# Patient Record
Sex: Male | Born: 1960 | Race: White | Hispanic: No | Smoking: Current every day smoker
Health system: Southern US, Community
[De-identification: ages and names within clinical notes are randomized; demographics above are authoritative.]

## PROBLEM LIST (undated history)

## (undated) DIAGNOSIS — E119 Type 2 diabetes mellitus without complications: Secondary | ICD-10-CM

## (undated) DIAGNOSIS — M1990 Unspecified osteoarthritis, unspecified site: Secondary | ICD-10-CM

## (undated) DIAGNOSIS — E785 Hyperlipidemia, unspecified: Secondary | ICD-10-CM

## (undated) DIAGNOSIS — IMO0001 Reserved for inherently not codable concepts without codable children: Secondary | ICD-10-CM

## (undated) DIAGNOSIS — I10 Essential (primary) hypertension: Secondary | ICD-10-CM

## (undated) DIAGNOSIS — M62838 Other muscle spasm: Secondary | ICD-10-CM

## (undated) DIAGNOSIS — F419 Anxiety disorder, unspecified: Secondary | ICD-10-CM

## (undated) HISTORY — PX: PILONIDAL CYST EXCISION: SHX744

---

## 1998-05-23 HISTORY — PX: BACK SURGERY: SHX140

## 1998-05-23 HISTORY — PX: HERNIA REPAIR: SHX51

## 1999-02-23 ENCOUNTER — Encounter: Payer: Self-pay | Admitting: Emergency Medicine

## 1999-02-23 ENCOUNTER — Emergency Department (HOSPITAL_COMMUNITY): Admission: EM | Admit: 1999-02-23 | Discharge: 1999-02-23 | Payer: Self-pay | Admitting: Emergency Medicine

## 2000-03-09 ENCOUNTER — Emergency Department (HOSPITAL_COMMUNITY): Admission: EM | Admit: 2000-03-09 | Discharge: 2000-03-09 | Payer: Self-pay | Admitting: Emergency Medicine

## 2001-07-18 ENCOUNTER — Encounter: Admission: RE | Admit: 2001-07-18 | Discharge: 2001-07-18 | Payer: Self-pay | Admitting: Family Medicine

## 2001-07-18 ENCOUNTER — Encounter: Payer: Self-pay | Admitting: Family Medicine

## 2001-08-16 ENCOUNTER — Encounter: Payer: Self-pay | Admitting: Neurosurgery

## 2001-08-16 ENCOUNTER — Ambulatory Visit (HOSPITAL_COMMUNITY): Admission: RE | Admit: 2001-08-16 | Discharge: 2001-08-17 | Payer: Self-pay | Admitting: Neurosurgery

## 2006-07-31 ENCOUNTER — Encounter: Admission: RE | Admit: 2006-07-31 | Discharge: 2006-07-31 | Payer: Self-pay | Admitting: Family Medicine

## 2008-08-15 ENCOUNTER — Emergency Department (HOSPITAL_COMMUNITY): Admission: EM | Admit: 2008-08-15 | Discharge: 2008-08-15 | Payer: Self-pay | Admitting: Emergency Medicine

## 2009-10-05 IMAGING — CT CT ABDOMEN W/ CM
2 series · 14 of 42 positions shown, 19 images · IV contrast (water & 100 ML OMNI 300)
Comparison: None

CT ABDOMEN

CLINICAL DATA: Abdominal pain with distention and shortness of
breath.

CT ABDOMEN AND PELVIS WITH CONTRAST
TECHNIQUE: Multidetector CT imaging of the abdomen and pelvis was
performed using the standard protocol following bolus
administration of intravenous contrast.
Contrast: 100 ml Vmnipaque-ONN

[Series 400: reformatted · coronal · 0.92mm/px · 13 of 110 slices shown, 17 images (1 of 2)]
[im 8/110  soft-tissue]
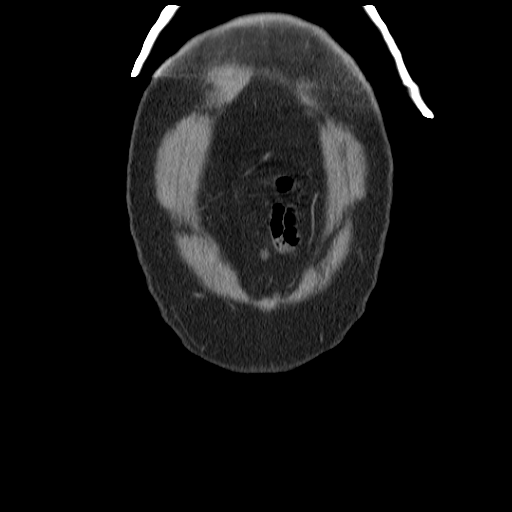
[im 8/110  lung]
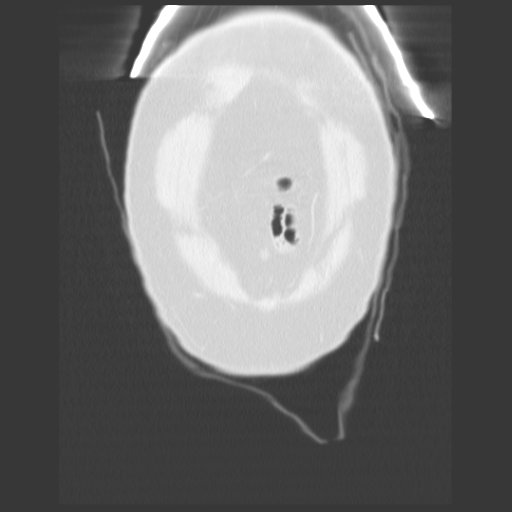
[im 8/110  bone]
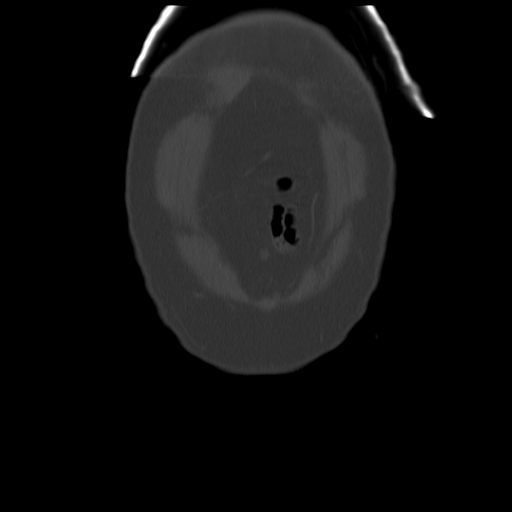
[im 16/110  soft-tissue]
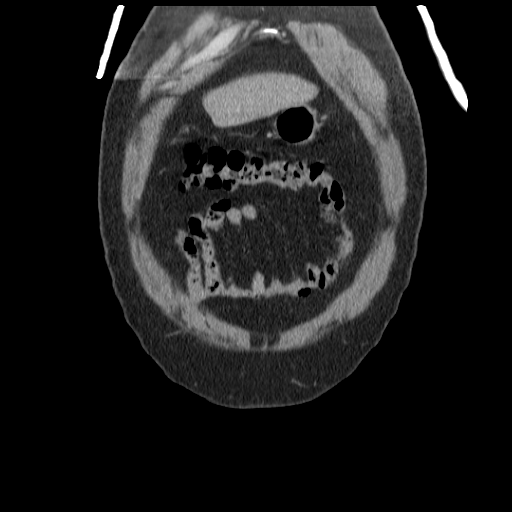
[im 16/110  lung]
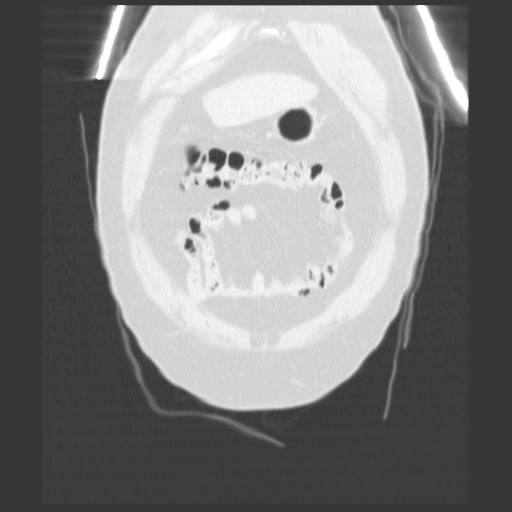
[im 24/110  lung]
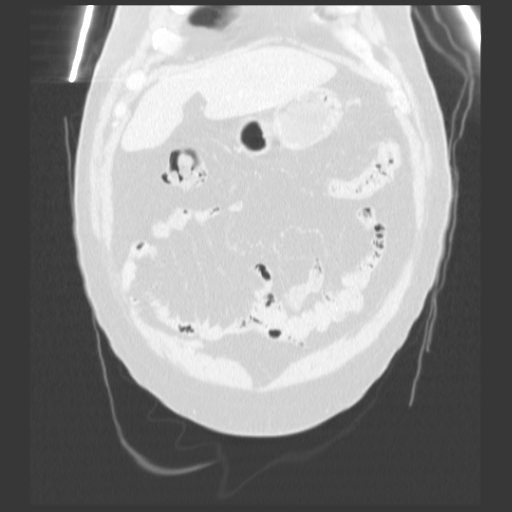
[im 25/110  soft-tissue]
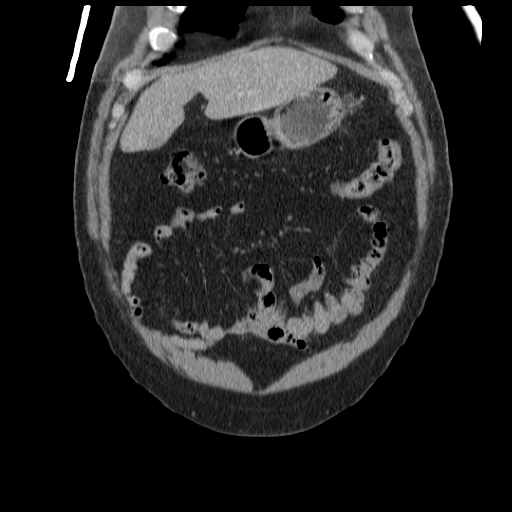
[im 32/110  lung]
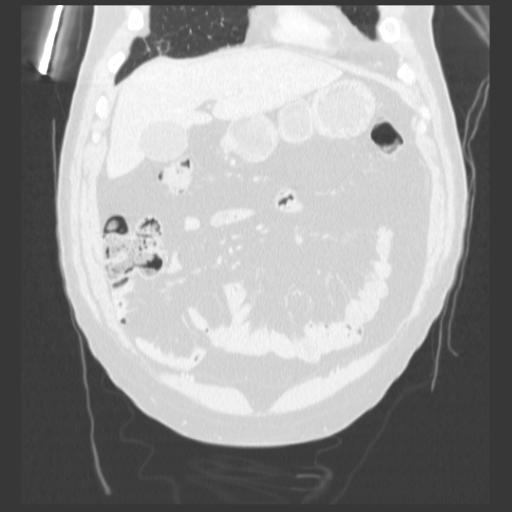
[im 37/110  soft-tissue]
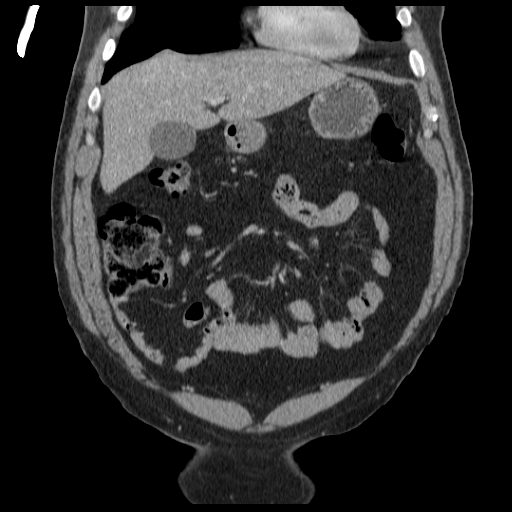
[im 47/110  soft-tissue]
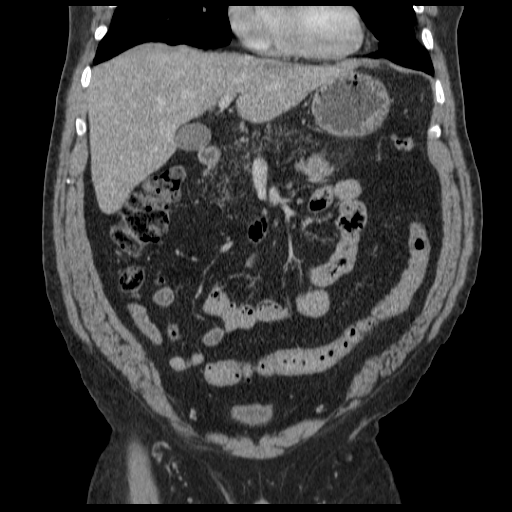
[im 55/110  soft-tissue]
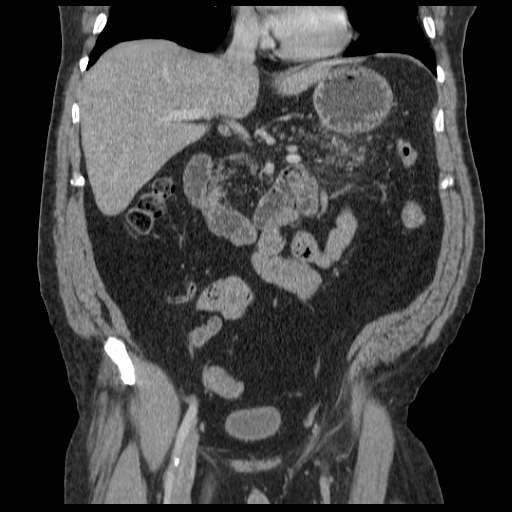
[im 63/110  soft-tissue]
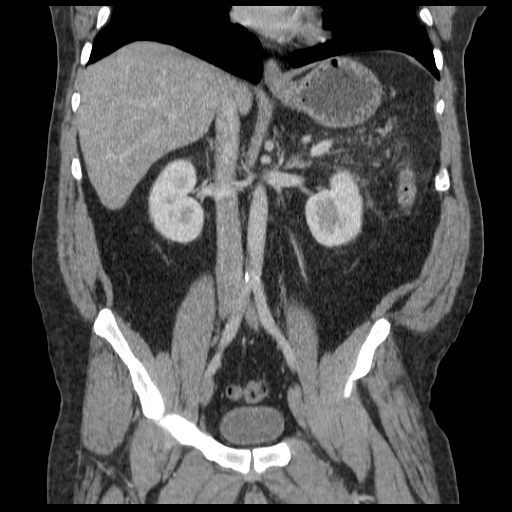
[im 73/110  soft-tissue]
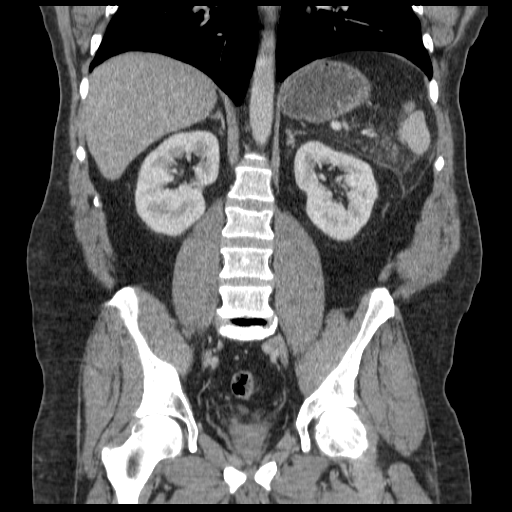
[im 85/110  soft-tissue]
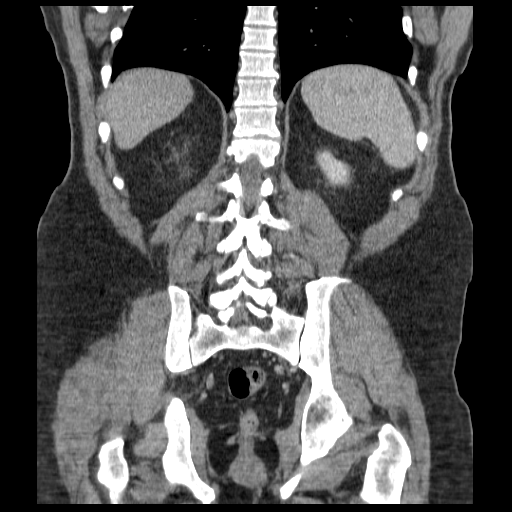
[im 85/110  bone]
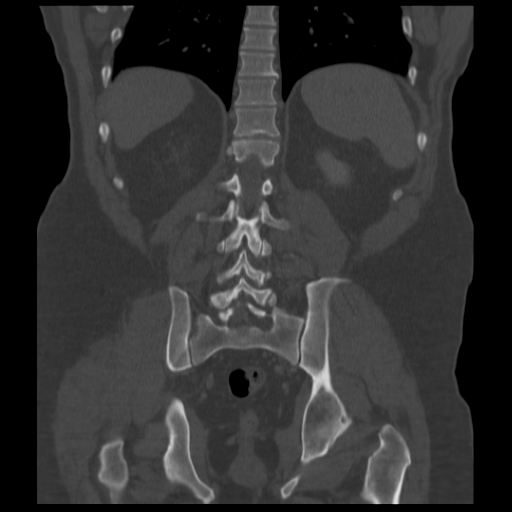
[im 94/110  soft-tissue]
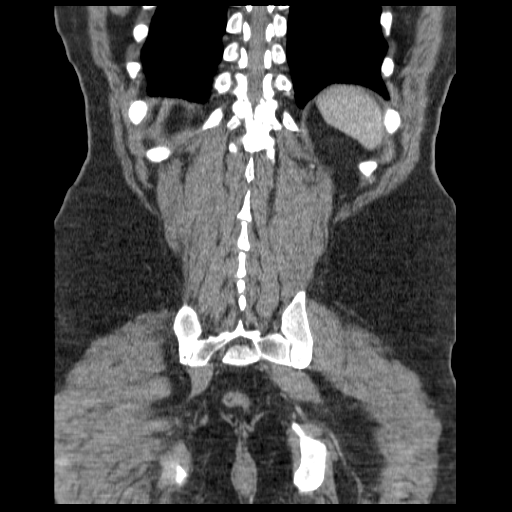
[im 102/110  soft-tissue]
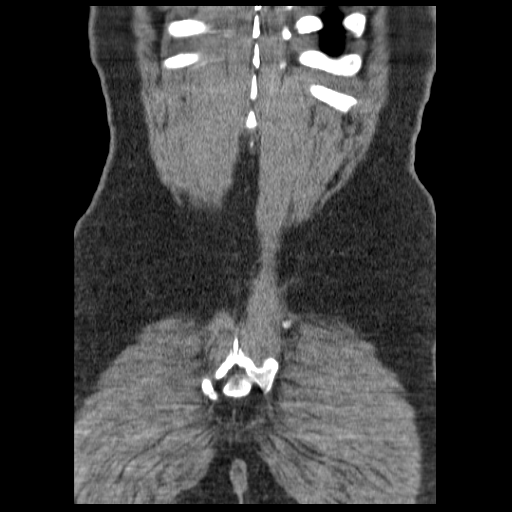

[Series 401: reformatted · sagittal · 0.92mm/px · 1 of 126 slices shown, 2 images (2 of 2)]
[im 63/126  soft-tissue]
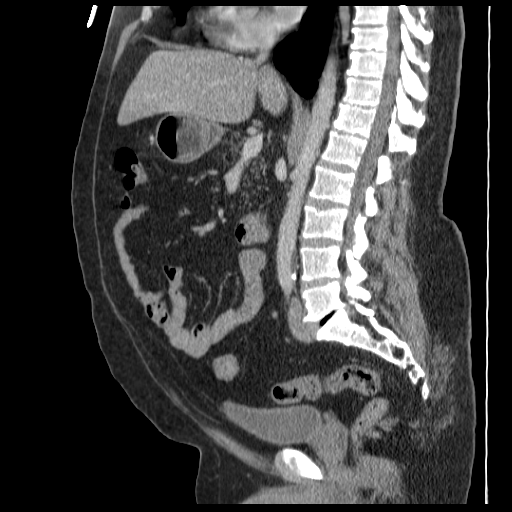
[im 63/126  bone]
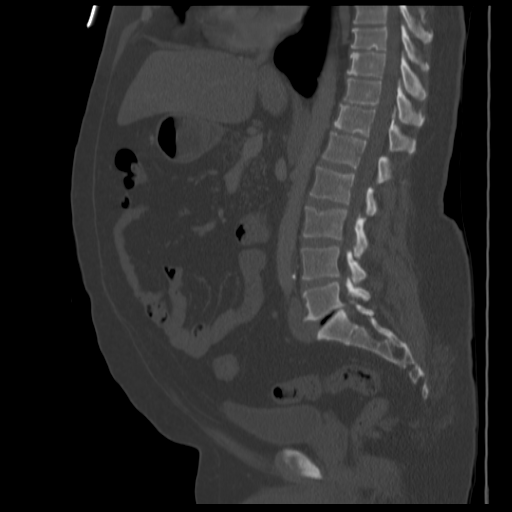

[14 of 42 positions shown; findings below may reference images not displayed]

FINDINGS: There is evidence of mild pancreatitis involving the
tail with peripancreatic inflammation and a small area of
thickening of the anterior aspect of Gerota's fascia.  There is no
pseudocyst formation or  ascites.

The liver, spleen, adrenal glands, and kidneys are normal.  There
are no dilated loops of large or small bowel.  No significant bony
abnormality.
IMPRESSION: Pancreatitis involving the distal one half of the pancreas with
mild peripancreatic inflammation.

CT PELVIS
FINDINGS: There are a few diverticuli in the distal colon.  No
diverticulitis.  The terminal ileum and appendix appear normal.  No
free fluid or bony abnormality.
IMPRESSION: Mild diverticulosis.

## 2009-11-08 ENCOUNTER — Emergency Department (HOSPITAL_COMMUNITY): Admission: EM | Admit: 2009-11-08 | Discharge: 2009-11-08 | Payer: Self-pay | Admitting: Emergency Medicine

## 2010-09-02 LAB — DIFFERENTIAL
Basophils Absolute: 0 10*3/uL (ref 0.0–0.1)
Eosinophils Absolute: 0.2 10*3/uL (ref 0.0–0.7)
Eosinophils Relative: 1 % (ref 0–5)
Lymphocytes Relative: 13 % (ref 12–46)
Monocytes Absolute: 0.8 10*3/uL (ref 0.1–1.0)

## 2010-09-02 LAB — COMPREHENSIVE METABOLIC PANEL
ALT: 31 U/L (ref 0–53)
AST: 20 U/L (ref 0–37)
Albumin: 4 g/dL (ref 3.5–5.2)
Alkaline Phosphatase: 107 U/L (ref 39–117)
CO2: 23 mEq/L (ref 19–32)
Chloride: 100 mEq/L (ref 96–112)
Creatinine, Ser: 0.93 mg/dL (ref 0.4–1.5)
GFR calc Af Amer: >60 mL/min (ref >60)
GFR calc non Af Amer: >60 mL/min (ref >60)
Potassium: 4.1 mEq/L (ref 3.5–5.1)
Sodium: 133 mEq/L — ABNORMAL LOW (ref 135–145)
Total Bilirubin: 0.7 mg/dL (ref 0.3–1.2)

## 2010-09-02 LAB — URINE MICROSCOPIC-ADD ON

## 2010-09-02 LAB — URINALYSIS, ROUTINE W REFLEX MICROSCOPIC
Glucose, UA: NEGATIVE mg/dL
Hgb urine dipstick: NEGATIVE
Specific Gravity, Urine: 1.027 (ref 1.005–1.030)
Urobilinogen, UA: 1 mg/dL (ref 0.0–1.0)

## 2010-09-02 LAB — CBC
MCV: 92.3 fL (ref 78.0–100.0)
Platelets: 233 10*3/uL (ref 150–400)
RBC: 5.13 MIL/uL (ref 4.22–5.81)
WBC: 13.2 10*3/uL — ABNORMAL HIGH (ref 4.0–10.5)

## 2010-09-02 LAB — LIPASE, BLOOD: Lipase: 67 U/L — ABNORMAL HIGH (ref 11–59)

## 2010-10-08 NOTE — Op Note (Signed)
[place]  Patient:    Kevin Fletcher, Kevin Fletcher Visit Number: [id number] MRN: [id number]          Service Type: DSU Location: 3000 3041 01 Attending Physician:  Jackelyn Knife Dictated by:   Izell [place] Elesa Hacker, M.D. Proc. Date: 08/16/01 Admit Date:  08/16/2001 Discharge Date: 08/17/2001                             Operative Report  PREOPERATIVE DIAGNOSIS:  Herniated intervertebral disk, right L5-S1.  POSTOPERATIVE DIAGNOSIS:  Herniated intervertebral disk, right L5-S1.  OPERATION PERFORMED:  Right L5-S1 hemilaminotomy and diskectomy.  SURGEON:  Izell [place]. Elesa Hacker, M.D.  ASSISTANT:  Clydene Fake, M.D.  DESCRIPTION OF PROCEDURE:  Under general endotracheal anesthesia, the usual subperiosteal approach was carried out to the right L5-S1 interlaminar space. The proper operative level was determined with intraoperative x-rays.  Using the operating microscope and a high speed drill, a hemilaminotomy was performed.  The ligamentum flavum was removed to reveal the dural sac and nerve root which were both displaced dorsally, and underlying soft tissue mass that proved to be a well localized disk herniation which was quite large with one small area of extrusion.  The annular defect was enlarged with a #15 blade, and a large quantity of degenerative disk material was removed. Careful search was then carried out to make sure that there were no retained fragments and none were found.  The wound was copiously irrigated with antibiotic solution and then closed in layers.  A sterile dressing was applied, and the patient was taken to the recovery room in good condition. Dictated by:   Izell [place] Elesa Hacker, M.D. Attending Physician:  Jackelyn Knife DD:  08/16/01 TD:  08/18/01 Job: [phone]

## 2010-10-08 NOTE — H&P (Signed)
[place]  Patient:    Kevin Fletcher, Kevin Fletcher Visit Number: [id number] MRN: [id number]          Service Type: DSU Location: 3000 3041 01 Attending Physician:  Jackelyn Knife Dictated by:   Izell [place] Elesa Hacker, M.D. Admit Date:  08/16/2001                           History and Physical  HISTORY OF PRESENT ILLNESS:  Mr. [name] is a 50 year old male who presents with a chief complaint of radiating pain in his right leg, but also some pain in the groin on the right side and in his right testicle.  He apparently had some difficulty with similar symptoms a year or so ago, but this has gotten worse since the onset.  He does recall falling in December of 2002 when he was hanging a ceiling fan, but at that point he did not necessarily have the current problem.  He also notes that he did have pain prior to that fall.  He has had really minimal back pain and mostly right lower extremity pain.  He has had no difficulty with bowel or bladder control. He notes that activity makes his right leg pain worse and his pain is relieved to some extent by reduced activity.  He had a lumbosacral MRI study done on July 18, 2001, which I reviewed. This study shows a discrimination at L5-S1 and centrally and right-sided.  He is admitted at this time for surgical decompression.  PAST MEDICAL HISTORY:  He has had borderline increased high blood pressure. Otherwise he has been healthy.  FAMILY HISTORY:  His mother is living at age 87, but is diabetic.  His father died at age 27 of throat cancer.  He has three brothers and one sister living and well.  He has no children.  PAST SURGICAL HISTORY:  He had repair of an umbilical hernia in 1997.  He had removal of a pilonidal cyst in 1998.  CURRENT MEDICATIONS:  None.  ALLERGIES:  He has no known drug allergies.  PERSONAL HISTORY:  He smokes a pack of cigarettes a day and drinks beer.  He is employed as a  Music therapist.  He does have people working for him so he is able to oversee jobs and does not have to do necessarily the hard physically demanding hands on type labor.  PHYSICAL EXAMINATION:  He is alert and cooperative.  WEIGHT:  222 pounds.  HEIGHT:  5 feet 10 inches tall.  VITAL SIGNS:  His blood pressure was 162/100 and rechecked at 146/98.  HEENT:  Examination is normal.  CHEST:  Clear.  ABDOMEN:  Soft and nontender with no palpable masses.  BACK:  Examination of the back and spine reveal a well-healed pilonidal cyst repair scar in the midline.  NEUROLOGIC:  He is able to walk in heel and toe at this time, but he has increased pain on weightbearing on the right.  Range of motion is limited because of stiffness and pain when he tilts to the right side.  Straight leg raising is quite painful on the right and negative on the left.  Deep tendon reflexes are 1-2+ and symmetrical, except for some diminution of the right ankle reflex.  He has some posterolateral loss of sensation on the right side. He is able to walk on heel and toe at this time.  IMPRESSION:  Herniated intravertebral disk at right L5-S1 with  a smaller one on the left side at L4-5.  The details and goals and risks of surgical decompression were explained that the risks include the risk of bleeding, infection, damage to the nerve root or common dural tube with CSF leak, resultant loss of sensation, and loss of motor function.  He understands that there is some risk involved with anesthetic.  We plan surgical decompression at L5-S1 on the right side tomorrow. Dictated by:   Izell [place] Elesa Hacker, M.D. Attending Physician:  Jackelyn Knife DD:  08/15/01 TD:  08/15/01 Job: [phone] [id number]

## 2014-05-28 ENCOUNTER — Other Ambulatory Visit (HOSPITAL_COMMUNITY): Payer: Self-pay | Admitting: Neurosurgery

## 2014-06-06 ENCOUNTER — Encounter (HOSPITAL_COMMUNITY)
Admission: RE | Admit: 2014-06-06 | Discharge: 2014-06-06 | Disposition: A | Discharge status: Home / Self Care | Payer: BLUE CROSS/BLUE SHIELD | Source: Ambulatory Visit | Attending: Neurosurgery | Admitting: Neurosurgery

## 2014-06-06 ENCOUNTER — Encounter (HOSPITAL_COMMUNITY): Payer: Self-pay

## 2014-06-06 DIAGNOSIS — Z79891 Long term (current) use of opiate analgesic: Secondary | ICD-10-CM | POA: Diagnosis not present

## 2014-06-06 DIAGNOSIS — G8929 Other chronic pain: Secondary | ICD-10-CM | POA: Diagnosis not present

## 2014-06-06 DIAGNOSIS — Y848 Other medical procedures as the cause of abnormal reaction of the patient, or of later complication, without mention of misadventure at the time of the procedure: Secondary | ICD-10-CM | POA: Insufficient documentation

## 2014-06-06 DIAGNOSIS — Z01812 Encounter for preprocedural laboratory examination: Secondary | ICD-10-CM | POA: Diagnosis present

## 2014-06-06 DIAGNOSIS — Z01818 Encounter for other preprocedural examination: Secondary | ICD-10-CM | POA: Diagnosis not present

## 2014-06-06 DIAGNOSIS — F17210 Nicotine dependence, cigarettes, uncomplicated: Secondary | ICD-10-CM | POA: Diagnosis not present

## 2014-06-06 DIAGNOSIS — I10 Essential (primary) hypertension: Secondary | ICD-10-CM | POA: Diagnosis not present

## 2014-06-06 DIAGNOSIS — M961 Postlaminectomy syndrome, not elsewhere classified: Secondary | ICD-10-CM | POA: Insufficient documentation

## 2014-06-06 DIAGNOSIS — M1990 Unspecified osteoarthritis, unspecified site: Secondary | ICD-10-CM | POA: Diagnosis not present

## 2014-06-06 DIAGNOSIS — E119 Type 2 diabetes mellitus without complications: Secondary | ICD-10-CM | POA: Diagnosis not present

## 2014-06-06 DIAGNOSIS — Z792 Long term (current) use of antibiotics: Secondary | ICD-10-CM | POA: Diagnosis not present

## 2014-06-06 HISTORY — DX: Type 2 diabetes mellitus without complications: E11.9

## 2014-06-06 HISTORY — DX: Essential (primary) hypertension: I10

## 2014-06-06 HISTORY — DX: Reserved for inherently not codable concepts without codable children: IMO0001

## 2014-06-06 HISTORY — DX: Unspecified osteoarthritis, unspecified site: M19.90

## 2014-06-06 LAB — BASIC METABOLIC PANEL
ANION GAP: 11 (ref 5–15)
BUN: 9 mg/dL (ref 6–23)
CHLORIDE: 103 meq/L (ref 96–112)
CO2: 26 mmol/L (ref 19–32)
Calcium: 9.6 mg/dL (ref 8.4–10.5)
Creatinine, Ser: 0.73 mg/dL (ref 0.50–1.35)
Glucose, Bld: 134 mg/dL — ABNORMAL HIGH (ref 70–99)
POTASSIUM: 4.1 mmol/L (ref 3.5–5.1)
Sodium: 140 mmol/L (ref 135–145)

## 2014-06-06 LAB — SURGICAL PCR SCREEN
MRSA, PCR: NEGATIVE
Staphylococcus aureus: POSITIVE — AB

## 2014-06-06 LAB — CBC
HEMATOCRIT: 42.4 % (ref 39.0–52.0)
HEMOGLOBIN: 14.8 g/dL (ref 13.0–17.0)
MCH: 31.6 pg (ref 26.0–34.0)
MCHC: 34.9 g/dL (ref 30.0–36.0)
MCV: 90.6 fL (ref 78.0–100.0)
Platelets: 258 10*3/uL (ref 150–400)
RBC: 4.68 MIL/uL (ref 4.22–5.81)
RDW: 12.7 % (ref 11.5–15.5)
WBC: 9.5 10*3/uL (ref 4.0–10.5)

## 2014-06-06 NOTE — Pre-Procedure Instructions (Addendum)
Kevin Fletcher  06/06/2014   Your procedure is scheduled on:  06/12/14  [facility] short [facility] at 1250 AM.  Call this number if you have problems the morning of surgery: [phone]   Remember:   Do not eat food or drink liquids after midnight.   Take these medicines the morning of surgery with A SIP OF WATER: gabapentin,pain if needed cymbalta unless dr tells you not too     STOP all herbel meds, nsaids (aleve,naproxen,advil,ibuprofen) 5 days prior to surgery starting tomorrow including vitamins,aspirin   Do not wear jewelry, make-up or nail polish.  Do not wear lotions, powders, or perfumes. You may wear deodorant.  Do not shave 48 hours prior to surgery. Men may shave face and neck.  Do not bring valuables to the hospital.  [facility] is not responsible                  for any belongings or valuables.               Contacts, dentures or bridgework may not be worn into surgery.  Leave suitcase in the car. After surgery it may be brought to your room.  For patients admitted to the hospital, discharge time is determined by your                treatment team.               Patients discharged the day of surgery will not be allowed to drive  home.  Name and phone number of your driver:   Special Instructions:  Special Instructions: [place] - Preparing for Surgery  Before surgery, you can play an important role.  Because skin is not sterile, your skin needs to be as free of germs as possible.  You can reduce the number of germs on you skin by washing with CHG (chlorahexidine gluconate) soap before surgery.  CHG is an antiseptic cleaner which kills germs and bonds with the skin to continue killing germs even after washing.  Please DO NOT use if you have an allergy to CHG or antibacterial soaps.  If your skin becomes reddened/irritated stop using the CHG and inform your nurse when you arrive at Short Stay.  Do not shave (including legs and underarms) for at least  48 hours prior to the first CHG shower.  You may shave your face.  Please follow these instructions carefully:   1.  Shower with CHG Soap the night before surgery and the morning of Surgery.  2.  If you choose to wash your hair, wash your hair first as usual with your normal shampoo.  3.  After you shampoo, rinse your hair and body thoroughly to remove the Shampoo.  4.  Use CHG as you would any other liquid soap.  You can apply chg directly  to the skin and wash gently with scrungie or a clean washcloth.  5.  Apply the CHG Soap to your body ONLY FROM THE NECK DOWN.  Do not use on open wounds or open sores.  Avoid contact with your eyes ears, mouth and genitals (private parts).  Wash genitals (private parts)       with your normal soap.  6.  Wash thoroughly, paying special attention to the area where your surgery will be performed.  7.  Thoroughly rinse your body with warm water from the neck down.  8.  DO NOT shower/wash with your normal soap after using and  rinsing off the CHG Soap.  9.  Pat yourself dry with a clean towel.            10.  Wear clean pajamas.            11.  Place clean sheets on your bed the night of your first shower and do not sleep with pets.  Day of Surgery  Do not apply any lotions/deodorants the morning of surgery.  Please wear clean clothes to the hospital/surgery center.   Please read over the following fact sheets that you were given: Pain Booklet, Coughing and Deep Breathing, MRSA Information and Surgical Site Infection Prevention

## 2014-06-06 NOTE — Progress Notes (Addendum)
Office called re: stop cymbalta        Patient had a procedure done in office to try to do stimulator non surgically.had to stop cymbalta before procedure due to ? Potential to cause bleeding? Marland Kitchen. Office to check with stimulator rep to see if need to stop before surgery. Sarah at office called and patient does not have to stop cymbalta.patient called and instructed to continue med

## 2014-06-11 MED ORDER — CEFAZOLIN SODIUM-DEXTROSE 2-3 GM-% IV SOLR
2.0000 g | INTRAVENOUS | Status: AC
  Administered 2014-06-12: 2 g via INTRAVENOUS

## 2014-06-12 ENCOUNTER — Encounter (HOSPITAL_COMMUNITY): Payer: Self-pay | Admitting: Certified Registered Nurse Anesthetist

## 2014-06-12 ENCOUNTER — Encounter (HOSPITAL_COMMUNITY)
Admission: RE | Disposition: A | Discharge status: Home / Self Care | Payer: Self-pay | Source: Ambulatory Visit | Attending: Neurosurgery

## 2014-06-12 ENCOUNTER — Ambulatory Visit (HOSPITAL_COMMUNITY): Payer: BLUE CROSS/BLUE SHIELD | Admitting: Certified Registered Nurse Anesthetist

## 2014-06-12 ENCOUNTER — Ambulatory Visit (HOSPITAL_COMMUNITY): Payer: BLUE CROSS/BLUE SHIELD

## 2014-06-12 ENCOUNTER — Ambulatory Visit (HOSPITAL_COMMUNITY)
Admission: RE | Admit: 2014-06-12 | Discharge: 2014-06-13 | Disposition: A | Discharge status: Home / Self Care | Payer: BLUE CROSS/BLUE SHIELD | Source: Ambulatory Visit | Attending: Neurosurgery | Admitting: Neurosurgery

## 2014-06-12 DIAGNOSIS — M961 Postlaminectomy syndrome, not elsewhere classified: Secondary | ICD-10-CM | POA: Diagnosis not present

## 2014-06-12 DIAGNOSIS — E119 Type 2 diabetes mellitus without complications: Secondary | ICD-10-CM | POA: Insufficient documentation

## 2014-06-12 DIAGNOSIS — I10 Essential (primary) hypertension: Secondary | ICD-10-CM | POA: Insufficient documentation

## 2014-06-12 DIAGNOSIS — M1990 Unspecified osteoarthritis, unspecified site: Secondary | ICD-10-CM | POA: Insufficient documentation

## 2014-06-12 DIAGNOSIS — G8929 Other chronic pain: Secondary | ICD-10-CM | POA: Diagnosis present

## 2014-06-12 DIAGNOSIS — Z79891 Long term (current) use of opiate analgesic: Secondary | ICD-10-CM | POA: Insufficient documentation

## 2014-06-12 DIAGNOSIS — Z792 Long term (current) use of antibiotics: Secondary | ICD-10-CM | POA: Insufficient documentation

## 2014-06-12 DIAGNOSIS — F17210 Nicotine dependence, cigarettes, uncomplicated: Secondary | ICD-10-CM | POA: Insufficient documentation

## 2014-06-12 HISTORY — PX: SPINAL CORD STIMULATOR TRIAL: SHX5380

## 2014-06-12 LAB — GLUCOSE, CAPILLARY
GLUCOSE-CAPILLARY: 102 mg/dL — AB (ref 70–99)
Glucose-Capillary: 91 mg/dL (ref 70–99)
Glucose-Capillary: 106 mg/dL — ABNORMAL HIGH (ref 70–99)

## 2014-06-12 SURGERY — LUMBAR SPINAL CORD STIMULATOR TRIAL
Anesthesia: General | Site: Back

## 2014-06-12 MED ORDER — SUCCINYLCHOLINE CHLORIDE 20 MG/ML IJ SOLN
INTRAMUSCULAR | Status: AC
  Filled 2014-06-12: qty 1

## 2014-06-12 MED ORDER — ACETAMINOPHEN 650 MG RE SUPP: 650.0000 mg | RECTAL | Status: DC | PRN

## 2014-06-12 MED ORDER — MIDAZOLAM HCL 2 MG/2ML IJ SOLN
INTRAMUSCULAR | Status: AC
  Filled 2014-06-12: qty 2

## 2014-06-12 MED ORDER — DULOXETINE HCL 30 MG PO CPEP
30.0000 mg | ORAL_CAPSULE | Freq: Two times a day (BID) | ORAL | Status: DC
  Administered 2014-06-13: 30 mg via ORAL
  Filled 2014-06-12 (×3): qty 1

## 2014-06-12 MED ORDER — POLYETHYLENE GLYCOL 3350 17 G PO PACK
17.0000 g | PACK | Freq: Every day | ORAL | Status: DC | PRN
  Filled 2014-06-12: qty 1

## 2014-06-12 MED ORDER — OXYCODONE-ACETAMINOPHEN 5-325 MG PO TABS
1.0000 | ORAL_TABLET | ORAL | Status: DC | PRN
  Administered 2014-06-12 – 2014-06-13 (×2): 2 via ORAL
  Filled 2014-06-12 (×2): qty 2

## 2014-06-12 MED ORDER — PHENYLEPHRINE HCL 10 MG/ML IJ SOLN
INTRAMUSCULAR | Status: AC
  Filled 2014-06-12: qty 1

## 2014-06-12 MED ORDER — HEMOSTATIC AGENTS (NO CHARGE) OPTIME
TOPICAL | Status: DC | PRN
  Administered 2014-06-12: 1 via TOPICAL

## 2014-06-12 MED ORDER — SODIUM CHLORIDE 0.9 % IJ SOLN: 3.0000 mL | INTRAMUSCULAR | Status: DC | PRN

## 2014-06-12 MED ORDER — PHENYLEPHRINE HCL 10 MG/ML IJ SOLN
INTRAMUSCULAR | Status: DC | PRN
  Administered 2014-06-12: 80 ug via INTRAVENOUS

## 2014-06-12 MED ORDER — FENTANYL CITRATE 0.05 MG/ML IJ SOLN
INTRAMUSCULAR | Status: AC
  Filled 2014-06-12: qty 5

## 2014-06-12 MED ORDER — ARTIFICIAL TEARS OP OINT
TOPICAL_OINTMENT | OPHTHALMIC | Status: DC | PRN
  Administered 2014-06-12: 1 via OPHTHALMIC

## 2014-06-12 MED ORDER — SODIUM CHLORIDE 0.9 % IV SOLN
INTRAVENOUS | Status: DC | PRN
  Administered 2014-06-12: 19:00:00 via INTRAVENOUS

## 2014-06-12 MED ORDER — OXYCODONE-ACETAMINOPHEN 5-325 MG PO TABS
ORAL_TABLET | ORAL | Status: AC
  Filled 2014-06-12: qty 2

## 2014-06-12 MED ORDER — ONDANSETRON HCL 4 MG/2ML IJ SOLN
INTRAMUSCULAR | Status: DC | PRN
Start: 2014-06-12 — End: 2014-06-12
  Administered 2014-06-12: 4 mg via INTRAVENOUS

## 2014-06-12 MED ORDER — LIDOCAINE HCL (CARDIAC) 20 MG/ML IV SOLN
INTRAVENOUS | Status: DC | PRN
  Administered 2014-06-12: 60 mg via INTRAVENOUS

## 2014-06-12 MED ORDER — LACTATED RINGERS IV SOLN
INTRAVENOUS | Status: DC | PRN
  Administered 2014-06-12: 18:00:00 via INTRAVENOUS

## 2014-06-12 MED ORDER — ACETAMINOPHEN 325 MG PO TABS: 650.0000 mg | ORAL_TABLET | ORAL | Status: DC | PRN

## 2014-06-12 MED ORDER — OXYCODONE HCL 5 MG PO TABS
10.0000 mg | ORAL_TABLET | Freq: Four times a day (QID) | ORAL | Status: DC
  Administered 2014-06-12: 10 mg via ORAL
  Filled 2014-06-12: qty 2

## 2014-06-12 MED ORDER — SODIUM CHLORIDE 0.9 % IV SOLN: 250.0000 mL | INTRAVENOUS | Status: DC

## 2014-06-12 MED ORDER — PHENOL 1.4 % MT LIQD: 1.0000 | OROMUCOSAL | Status: DC | PRN

## 2014-06-12 MED ORDER — ENALAPRIL MALEATE 20 MG PO TABS
20.0000 mg | ORAL_TABLET | Freq: Every day | ORAL | Status: DC
  Filled 2014-06-12: qty 1

## 2014-06-12 MED ORDER — HYDROMORPHONE HCL 1 MG/ML IJ SOLN
INTRAMUSCULAR | Status: AC
  Filled 2014-06-12: qty 1

## 2014-06-12 MED ORDER — ONDANSETRON HCL 4 MG/2ML IJ SOLN: 4.0000 mg | Freq: Once | INTRAMUSCULAR | Status: DC | PRN

## 2014-06-12 MED ORDER — HYDROMORPHONE HCL 1 MG/ML IJ SOLN
0.5000 mg | INTRAMUSCULAR | Status: DC | PRN
  Administered 2014-06-12: 0.5 mg via INTRAVENOUS

## 2014-06-12 MED ORDER — EPHEDRINE SULFATE 50 MG/ML IJ SOLN
INTRAMUSCULAR | Status: DC | PRN
Start: 2014-06-12 — End: 2014-06-12
  Administered 2014-06-12 (×2): 10 mg via INTRAVENOUS

## 2014-06-12 MED ORDER — CEPHALEXIN 500 MG PO CAPS: 500.0000 mg | ORAL_CAPSULE | Freq: Four times a day (QID) | ORAL | Status: AC

## 2014-06-12 MED ORDER — DEXAMETHASONE SODIUM PHOSPHATE 4 MG/ML IJ SOLN
INTRAMUSCULAR | Status: AC
  Filled 2014-06-12: qty 1

## 2014-06-12 MED ORDER — SENNA 8.6 MG PO TABS
1.0000 | ORAL_TABLET | Freq: Two times a day (BID) | ORAL | Status: DC
  Administered 2014-06-12: 8.6 mg via ORAL
  Filled 2014-06-12 (×2): qty 1

## 2014-06-12 MED ORDER — LACTATED RINGERS IV SOLN
INTRAVENOUS | Status: DC
  Administered 2014-06-12: 13:00:00 via INTRAVENOUS

## 2014-06-12 MED ORDER — DIAZEPAM 5 MG PO TABS
ORAL_TABLET | ORAL | Status: AC
  Filled 2014-06-12: qty 1

## 2014-06-12 MED ORDER — EPHEDRINE SULFATE 50 MG/ML IJ SOLN
INTRAMUSCULAR | Status: AC
  Filled 2014-06-12: qty 1

## 2014-06-12 MED ORDER — PROPOFOL 10 MG/ML IV BOLUS
INTRAVENOUS | Status: DC | PRN
  Administered 2014-06-12: 200 mg via INTRAVENOUS

## 2014-06-12 MED ORDER — SODIUM CHLORIDE 0.9 % IJ SOLN: 3.0000 mL | Freq: Two times a day (BID) | INTRAMUSCULAR | Status: DC

## 2014-06-12 MED ORDER — CYCLOBENZAPRINE HCL 10 MG PO TABS: 10.0000 mg | ORAL_TABLET | Freq: Three times a day (TID) | ORAL | Status: AC | PRN

## 2014-06-12 MED ORDER — OXYCODONE HCL 5 MG PO TABS
ORAL_TABLET | ORAL | Status: AC
  Filled 2014-06-12: qty 2

## 2014-06-12 MED ORDER — GLYCOPYRROLATE 0.2 MG/ML IJ SOLN
INTRAMUSCULAR | Status: AC
  Filled 2014-06-12: qty 5

## 2014-06-12 MED ORDER — DIAZEPAM 5 MG PO TABS
5.0000 mg | ORAL_TABLET | Freq: Four times a day (QID) | ORAL | Status: DC | PRN
  Administered 2014-06-12 – 2014-06-13 (×2): 5 mg via ORAL
  Filled 2014-06-12 (×2): qty 1

## 2014-06-12 MED ORDER — 0.9 % SODIUM CHLORIDE (POUR BTL) OPTIME
TOPICAL | Status: DC | PRN
  Administered 2014-06-12: 1000 mL

## 2014-06-12 MED ORDER — GLYCOPYRROLATE 0.2 MG/ML IJ SOLN
INTRAMUSCULAR | Status: DC | PRN
  Administered 2014-06-12: 0.2 mg via INTRAVENOUS

## 2014-06-12 MED ORDER — ONDANSETRON HCL 4 MG/2ML IJ SOLN: 4.0000 mg | INTRAMUSCULAR | Status: DC | PRN

## 2014-06-12 MED ORDER — METFORMIN HCL ER 500 MG PO TB24
500.0000 mg | ORAL_TABLET | Freq: Two times a day (BID) | ORAL | Status: DC
  Filled 2014-06-12 (×3): qty 1

## 2014-06-12 MED ORDER — SUCCINYLCHOLINE CHLORIDE 20 MG/ML IJ SOLN
INTRAMUSCULAR | Status: DC | PRN
  Administered 2014-06-12: 100 mg via INTRAVENOUS

## 2014-06-12 MED ORDER — POTASSIUM CHLORIDE IN NACL 20-0.9 MEQ/L-% IV SOLN
INTRAVENOUS | Status: DC
  Filled 2014-06-12 (×2): qty 1000

## 2014-06-12 MED ORDER — HYDROMORPHONE HCL 1 MG/ML IJ SOLN: 0.5000 mg | INTRAMUSCULAR | Status: DC | PRN

## 2014-06-12 MED ORDER — FENTANYL CITRATE 0.05 MG/ML IJ SOLN
INTRAMUSCULAR | Status: DC | PRN
  Administered 2014-06-12: 50 ug via INTRAVENOUS
  Administered 2014-06-12: 100 ug via INTRAVENOUS
  Administered 2014-06-12: 50 ug via INTRAVENOUS
  Administered 2014-06-12: 100 ug via INTRAVENOUS
  Administered 2014-06-12: 150 ug via INTRAVENOUS

## 2014-06-12 MED ORDER — THROMBIN 5000 UNITS EX SOLR
CUTANEOUS | Status: DC | PRN
  Administered 2014-06-12 (×2): 5000 [IU] via TOPICAL

## 2014-06-12 MED ORDER — KETOROLAC TROMETHAMINE 30 MG/ML IJ SOLN
30.0000 mg | Freq: Four times a day (QID) | INTRAMUSCULAR | Status: DC
  Administered 2014-06-13 (×2): 30 mg via INTRAVENOUS
  Filled 2014-06-12 (×5): qty 1

## 2014-06-12 MED ORDER — LIDOCAINE HCL 4 % MT SOLN
OROMUCOSAL | Status: DC | PRN
Start: 2014-06-12 — End: 2014-06-12
  Administered 2014-06-12: 5 mL via TOPICAL

## 2014-06-12 MED ORDER — LIDOCAINE-EPINEPHRINE 0.5 %-1:200000 IJ SOLN
INTRAMUSCULAR | Status: DC | PRN
  Administered 2014-06-12: 4 mg

## 2014-06-12 MED ORDER — MENTHOL 3 MG MT LOZG: 1.0000 | LOZENGE | OROMUCOSAL | Status: DC | PRN

## 2014-06-12 MED ORDER — GABAPENTIN 300 MG PO CAPS
600.0000 mg | ORAL_CAPSULE | Freq: Two times a day (BID) | ORAL | Status: DC
  Administered 2014-06-13: 600 mg via ORAL
  Filled 2014-06-12 (×3): qty 2

## 2014-06-12 MED ORDER — HYDROCODONE-ACETAMINOPHEN 5-325 MG PO TABS: 1.0000 | ORAL_TABLET | ORAL | Status: DC | PRN

## 2014-06-12 MED ORDER — CEFAZOLIN SODIUM 1-5 GM-% IV SOLN
1.0000 g | Freq: Three times a day (TID) | INTRAVENOUS | Status: DC
  Administered 2014-06-13: 1 g via INTRAVENOUS
  Filled 2014-06-12 (×2): qty 50

## 2014-06-12 MED ORDER — OXYCODONE HCL 10 MG PO TABS: 10.0000 mg | ORAL_TABLET | Freq: Four times a day (QID) | ORAL | Status: AC

## 2014-06-12 MED ORDER — ONDANSETRON HCL 4 MG/2ML IJ SOLN
INTRAMUSCULAR | Status: AC
  Filled 2014-06-12: qty 2

## 2014-06-12 SURGICAL SUPPLY — 49 items
BLADE CLIPPER SURG (BLADE) IMPLANT
BUR MATCHSTICK NEURO 3.0 LAGG (BURR) ×3 IMPLANT
CABLE OR STIMULATOR 2X8 61 (WIRE) ×2 IMPLANT
CABLE OR STIMULATOR 2X8 61CM (WIRE) ×2
CONT SPEC 4OZ CLIKSEAL STRL BL (MISCELLANEOUS) ×2 IMPLANT
CoverEdge 32 Surgical Lead kit (Lead) ×2 IMPLANT
DECANTER SPIKE VIAL GLASS SM (MISCELLANEOUS) ×3 IMPLANT
DRAPE C-ARM 42X72 X-RAY (DRAPES) ×5 IMPLANT
DRAPE CAMERA VIDEO/LASER (DRAPES) ×3 IMPLANT
DRAPE ORTHO SPLIT 77X108 STRL (DRAPES) ×6
DRAPE POUCH INSTRU U-SHP 10X18 (DRAPES) ×3 IMPLANT
DRAPE SURG ORHT 6 SPLT 77X108 (DRAPES) ×2 IMPLANT
DRSG OPSITE POSTOP 4X6 (GAUZE/BANDAGES/DRESSINGS) ×2 IMPLANT
DURAPREP 26ML APPLICATOR (WOUND CARE) ×3 IMPLANT
ELECT CAUTERY BLADE 6.4 (BLADE) ×3 IMPLANT
EXTENSION 35CM (Spinal Cord Stimulator) ×8 IMPLANT
GLOVE BIOGEL PI IND STRL 7.5 (GLOVE) IMPLANT
GLOVE BIOGEL PI INDICATOR 7.5 (GLOVE) ×4
GLOVE ECLIPSE 6.5 STRL STRAW (GLOVE) ×3 IMPLANT
GLOVE ECLIPSE 7.5 STRL STRAW (GLOVE) ×6 IMPLANT
GOWN STRL REUS W/ TWL LRG LVL3 (GOWN DISPOSABLE) ×2 IMPLANT
GOWN STRL REUS W/ TWL XL LVL3 (GOWN DISPOSABLE) IMPLANT
GOWN STRL REUS W/TWL 2XL LVL3 (GOWN DISPOSABLE) IMPLANT
GOWN STRL REUS W/TWL LRG LVL3 (GOWN DISPOSABLE) ×3
GOWN STRL REUS W/TWL XL LVL3 (GOWN DISPOSABLE) ×3
KIT BASIN OR (CUSTOM PROCEDURE TRAY) ×3 IMPLANT
KIT PT TRIAL SPINAL STIMULATOR (KITS) ×2 IMPLANT
KIT ROOM TURNOVER OR (KITS) ×3 IMPLANT
LIQUID BAND (GAUZE/BANDAGES/DRESSINGS) ×3 IMPLANT
NDL HYPO 25X1 1.5 SAFETY (NEEDLE) ×1 IMPLANT
NDL SPNL 22GX3.5 QUINCKE BK (NEEDLE) IMPLANT
NEEDLE HYPO 25X1 1.5 SAFETY (NEEDLE) ×3 IMPLANT
NEEDLE SPNL 22GX3.5 QUINCKE BK (NEEDLE) ×3 IMPLANT
NS IRRIG 1000ML POUR BTL (IV SOLUTION) ×3 IMPLANT
PACK LAMINECTOMY NEURO (CUSTOM PROCEDURE TRAY) ×3 IMPLANT
PAD ARMBOARD 7.5X6 YLW CONV (MISCELLANEOUS) ×9 IMPLANT
SPONGE SURGIFOAM ABS GEL SZ50 (HEMOSTASIS) ×3 IMPLANT
SUT SILK 2 0 TIES 10X30 (SUTURE) ×3 IMPLANT
SUT VIC AB 0 CT1 18XCR BRD8 (SUTURE) ×1 IMPLANT
SUT VIC AB 0 CT1 8-18 (SUTURE) ×3
SUT VIC AB 2-0 CT1 18 (SUTURE) ×3 IMPLANT
SUT VIC AB 3-0 SH 8-18 (SUTURE) ×3 IMPLANT
SYR 20ML ECCENTRIC (SYRINGE) ×3 IMPLANT
SYR BULB 3OZ (MISCELLANEOUS) ×3 IMPLANT
SYR CONTROL 10ML LL (SYRINGE) ×3 IMPLANT
TOOL LONG TUNNEL (SPINAL CORD STIMULATOR) ×2 IMPLANT
TOWEL OR 17X24 6PK STRL BLUE (TOWEL DISPOSABLE) ×3 IMPLANT
TOWEL OR 17X26 10 PK STRL BLUE (TOWEL DISPOSABLE) ×3 IMPLANT
WATER STERILE IRR 1000ML POUR (IV SOLUTION) ×3 IMPLANT

## 2014-06-12 NOTE — Transfer of Care (Signed)
Immediate Anesthesia Transfer of Care Note  Patient: Kevin LakeWilliam R Fletcher  Procedure(s) Performed: Procedure(s) with comments: LUMBAR SPINAL CORD STIMULATOR TRIAL (N/A) - spinal cord stimulator trial with paddle lead-top of T8 final position  Patient Location: PACU  Anesthesia Type:General  Level of Consciousness: awake, alert  and oriented  Airway & Oxygen Therapy: Patient Spontanous Breathing and Patient connected to nasal cannula oxygen  Post-op Assessment: Report given to PACU RN and Post -op Vital signs reviewed and stable  Post vital signs: Reviewed and stable  Complications: No apparent anesthesia complications

## 2014-06-12 NOTE — Anesthesia Procedure Notes (Signed)
Procedure Name: Intubation Date/Time: 06/12/2014 5:58 PM Performed by: Ollen Bowl Pre-anesthesia Checklist: Patient identified, Emergency Drugs available, Suction available, Patient being monitored and Timeout performed Patient Re-evaluated:Patient Re-evaluated prior to inductionOxygen Delivery Method: Circle system utilized and Simple face mask Preoxygenation: Pre-oxygenation with 100% oxygen Intubation Type: IV induction Ventilation: Mask ventilation without difficulty Laryngoscope Size: Mac and 4 Grade View: Grade I Tube type: Oral Tube size: 7.5 mm Number of attempts: 1 Airway Equipment and Method: Patient positioned with wedge pillow,  Stylet and LTA kit utilized Placement Confirmation: ETT inserted through vocal cords under direct vision,  positive ETCO2 and breath sounds checked- equal and bilateral Secured at: 22 cm Tube secured with: Tape Dental Injury: Teeth and Oropharynx as per pre-operative assessment

## 2014-06-12 NOTE — Discharge Summary (Signed)
  Physician Discharge Summary  Patient ID: Kevin LakeWilliam R Fletcher MRN: [id number] DOB/AGE: 252/20/1962 54 y.o.  Admit date: 06/12/2014 Discharge date: 06/12/2014  Admission Diagnoses:failed back syndrome  Discharge Diagnoses: failed back syndrome Active Problems:   Chronic pain   Discharged Condition: good  Hospital Course: Kevin Fletcher was admitted and taken to the operating room for placement of a spinal cord stimulator. The stimulator was placed spanning all of t8, and the caudal portion of T7. His wound at discharge is clean, dry, and without signs of infection. He is voiding, ambulating, and tolerating a regular diet at discharge.   Treatments: surgery: as above  Discharge Exam: Blood pressure 120/60, pulse 78, temperature 98 F (36.7 C), temperature source Oral, resp. rate 16, weight 92.987 kg (205 lb), SpO2 97 %. General appearance: alert, cooperative, appears stated age and no distress Neurologic: Alert and oriented X 3, normal strength and tone. Normal symmetric reflexes. Normal coordination and gait  Disposition:  postlaminectomy syndrome    Medication List    TAKE these medications        atorvastatin 20 MG tablet  Commonly known as:  LIPITOR  Take 20 mg by mouth daily.     cephALEXin 500 MG capsule  Commonly known as:  KEFLEX  Take 1 capsule (500 mg total) by mouth 4 (four) times daily.     cyclobenzaprine 10 MG tablet  Commonly known as:  FLEXERIL  Take 1 tablet (10 mg total) by mouth 3 (three) times daily as needed for muscle spasms.     DULoxetine 30 MG capsule  Commonly known as:  CYMBALTA  Take 30 mg by mouth 2 (two) times daily.     enalapril 20 MG tablet  Commonly known as:  VASOTEC  Take 20 mg by mouth daily.     gabapentin 300 MG capsule  Commonly known as:  NEURONTIN  Take 600 mg by mouth 2 (two) times daily.     metFORMIN 500 MG 24 hr tablet  Commonly known as:  GLUCOPHAGE-XR  Take 500 mg by mouth 2 (two) times daily.     Oxycodone HCl 10 MG  Tabs  Take 10 mg by mouth 4 (four) times daily. scheduled     Oxycodone HCl 10 MG Tabs  Take 1 tablet (10 mg total) by mouth 4 (four) times daily.           Follow-up Information    Follow up with Audianna Landgren L, MD.   Specialty:  Neurosurgery   Why:  call office on monday to report on the effectiveness of the spinal cord stimulator   Contact information:   [address] [place] [address] [phone]       Signed: Carmela HurtCABBELL,Mersades Barbaro L 06/12/2014, 8:55 PM

## 2014-06-12 NOTE — Anesthesia Preprocedure Evaluation (Signed)
Anesthesia Evaluation  Patient identified by MRN, date of birth, ID band Patient awake    Reviewed: Allergy & Precautions, NPO status , Patient's Chart, lab work & pertinent test results  Airway        Dental   Pulmonary Current Smoker,          Cardiovascular hypertension,     Neuro/Psych    GI/Hepatic   Endo/Other  diabetes, Type 2, Oral Hypoglycemic Agents  Renal/GU      Musculoskeletal  (+) Arthritis -,   Abdominal   Peds  Hematology   Anesthesia Other Findings   Reproductive/Obstetrics                             Anesthesia Physical Anesthesia Plan  ASA: II  Anesthesia Plan: General   Post-op Pain Management:    Induction: Intravenous  Airway Management Planned: Oral ETT  Additional Equipment:   Intra-op Plan:   Post-operative Plan: Extubation in OR  Informed Consent: I have reviewed the patients History and Physical, chart, labs and discussed the procedure including the risks, benefits and alternatives for the proposed anesthesia with the patient or authorized representative who has indicated his/her understanding and acceptance.     Plan Discussed with: CRNA, Anesthesiologist and Surgeon  Anesthesia Plan Comments:         Anesthesia Quick Evaluation

## 2014-06-12 NOTE — Anesthesia Postprocedure Evaluation (Signed)
Anesthesia Post Note  Patient: Kevin Fletcher  Procedure(s) Performed: Procedure(s) (LRB): LUMBAR SPINAL CORD STIMULATOR TRIAL (N/A)  Anesthesia type: General  Patient location: PACU  Post pain: Pain level controlled and Adequate analgesia  Post assessment: Post-op Vital signs reviewed, Patient's Cardiovascular Status Stable, Respiratory Function Stable, Patent Airway and Pain level controlled  Last Vitals:  Filed Vitals:   06/12/14 2100  BP: 100/44  Pulse: 68  Temp:   Resp: 11    Post vital signs: Reviewed and stable  Level of consciousness: awake, alert  and oriented  Complications: No apparent anesthesia complications

## 2014-06-12 NOTE — H&P (Signed)
BP 156/74 mmHg  Pulse 60  Temp(Src) 97 F (36.1 C) (Oral)  Resp 20  Wt 92.987 kg (205 lb)  SpO2 100%  [name] is a 54 year old right-handed gentleman who presents today for evaluation of severe pain that he has in both feet and pain that he has in his legs.  He says he cannot bear to have something in the front pockets of his pants or shorts because it will set off his thighs into the burning and tingling.  He has tried Lyrica without any success for this.  He was seen by Dr. Ralene Fletcher who felt that he had a peripheral neuropathy and did have a bad sugar reading somewhere around 500.  He was rechecked at another facility and the sugar was less, so it was not clear whether or not he had diabetes but he certainly was acting like he did.  Kevin Fletcher says that he has had pain in his lower back now for at least 14 months.  He underwent injections into the lumbar region 14 months ago and was able to get back to work.  However, over the last two weeks he says he has had very severe muscle spasms, which are new.  He hurts currently now when he walks.  Nothing really helps the pain.  He is taking oxycodone 10 mg.  He says his feet are burning and are numb and have been that way for the last three to four months, along with the pain being worse.  The tingling and shooting sensations that he has in his feet have been increasing.  He says his right knee will give out at times and he has just noticed that the last few weeks.  He is not sure that has anything to do with this.  If he walks for any distance or has to stand for any period of time, he will have pain.      REVIEW OF SYSTEMS:                                    Positive for leg pain with walking, indigestion, leg weakness, back and leg pain, joint pain in his knee and knuckles.  He reports diabetes.  He denies Allergic, Hematologic, Psychiatric, Neurological, Skin, Genitourinary, Respiratory, Ears, Nose, Throat, Mouth, Eye or Constitutional  problems.        PAST MEDICAL HISTORY:                                             Prior Operations:  He did have right ankle surgery 30 years ago along with back surgery in 2002 and an umbilical herniorrhaphy.  Pilonidal cyst procedure in 1998.               Medications and Allergies:  No other medications aside from the pain medication which he mentioned.     SOCIAL HISTORY:                                            He smokes a pack of cigarettes a day.  He does use alcohol.  He does not use illicit drugs.     PHYSICAL EXAMINATION:  He is 172 cm in height.  He weighs 225 pounds.               HEENT - All mucosa is normal.  Sclerae not injected.              Neck - No cervical masses or bruits.              Respiratory - Lungs are clear.              Cardiovascular - Heart regular rhythm and rate.  No murmurs or rubs appreciated.  Pulse is good at the wrist bilaterally.      NEUROLOGICAL EXAMINATION:           He is alert and oriented x4 and answering all questions appropriately.  He has decreased vibratory sense in both lower extremities with a 256 Hz tuning fork.  Positive Romberg test.  Great difficulty with tandem walking.  He can toe walk and heel walk.  Reflexes are 2+ at the knees, at the ankles, biceps, triceps and brachioradialis.  Pupils equal, round and reactive to light.  Full extraocular movements.  Full visual fields.  Symmetric facies sensation.  Hearing intact to finger rub bilaterally.  Uvula elevates in the midline.  Shoulder shrug is normal.  Tongue protrudes in the midline.  Normal muscle tone, bulk and coordination in the upper extremities.      Kevin Fletcher  has had his questions answered and would like to proceed with a spinal cord stimulator trial.  He understands the procedure.  We went over the incision, how it is placed, why we are doing, and he understands.  I will see Kevin Fletcher for a stimulator trial on  06/12/2014.

## 2014-06-12 NOTE — Discharge Instructions (Signed)
[name] you have had a spinal cord stimulator placed for a trial of its effectiveness.  Please call if the area around the exiting leads becomes very red or tender. I will give you an antibiotic to take during the time period of the trial. Call the office at [phone] if you have a temperature greater than 101.5, if the wound starts to drain, or again becomes very red or tender. Sponge bathe during the trial period.  You may eat whatever you like. You should not engage in heavy physical exertion during this time.  Leave the dressings in place.

## 2014-06-12 NOTE — Op Note (Signed)
06/12/2014  7:52 PM  PATIENT:  Kevin Fletcher  54 y.o. male  PRE-OPERATIVE DIAGNOSIS:  postlaminectomy syndrome  POST-OPERATIVE DIAGNOSIS:  postlaminectomy syndrome  PROCEDURE:  Procedure(s): LUMBAR SPINAL CORD STIMULATOR placement via laminectomy T11 for trial stage one  SURGEON: Surgeon(s): Coletta MemosKyle Lamarco Gudiel, MD  ASSISTANTS:none  ANESTHESIA:   general  EBL:     BLOOD ADMINISTERED:none  CELL SAVER GIVEN:none  COUNT:per nursing  DRAINS: none   SPECIMEN:  No Specimen  DICTATION: Kevin LakeWilliam R Staples was taken to the operating room, intubated, and placed under a general anesthetic without difficulty. He was positioned prone on the [place] table with all pressure points properly padded. His back was prepared and draped in a sterile manner. I with fluoroscopy localized the T10 level, then marked my planned incision. I opened the skin with a 10 blade and carried the dissection down to the thoracolumbar fascia. I exposed bilaterally the lamina of T10, and T11. I used the drill and Kerrison punches to perform a hemilaminectomy of T11. I then threaded the paddle to the t7/8 disc space. I used fluoroscopy to confirm my location. I then irrigated the wound. I connected the extension leads, and tunneled them to his right flank bringing them out of the skin.  I closed the thoracic wound leaving the leads above the fascial closure. I approximated the subcutaneous and subcuticular planes with vicryl sutures. I placed sterile dressings over the leads and back wound. We confirmed the paddle with a diagnostic test. He was turned supine and extubated.  PLAN OF CARE: Admit for overnight observation  PATIENT DISPOSITION:  PACU - hemodynamically stable.   Delay start of Pharmacological VTE agent (>24hrs) due to surgical blood loss or risk of bleeding:  yes

## 2014-06-13 DIAGNOSIS — M961 Postlaminectomy syndrome, not elsewhere classified: Secondary | ICD-10-CM | POA: Diagnosis not present

## 2014-06-13 NOTE — Progress Notes (Signed)
Discharge instructions/education given to patient with significant other at bedside, they both verbalized understanding.

## 2014-06-17 ENCOUNTER — Encounter (HOSPITAL_COMMUNITY): Payer: Self-pay | Admitting: Neurosurgery

## 2014-06-24 ENCOUNTER — Encounter (HOSPITAL_COMMUNITY): Payer: Self-pay | Admitting: *Deleted

## 2014-06-24 ENCOUNTER — Other Ambulatory Visit (HOSPITAL_COMMUNITY): Payer: Self-pay | Admitting: Neurosurgery

## 2014-06-25 ENCOUNTER — Ambulatory Visit (HOSPITAL_COMMUNITY): Payer: BLUE CROSS/BLUE SHIELD | Admitting: Anesthesiology

## 2014-06-25 ENCOUNTER — Ambulatory Visit (HOSPITAL_COMMUNITY): Payer: BLUE CROSS/BLUE SHIELD

## 2014-06-25 ENCOUNTER — Ambulatory Visit (HOSPITAL_COMMUNITY)
Admission: RE | Admit: 2014-06-25 | Discharge: 2014-06-25 | Disposition: A | Discharge status: Home / Self Care | Payer: BLUE CROSS/BLUE SHIELD | Source: Ambulatory Visit | Attending: Neurosurgery | Admitting: Neurosurgery

## 2014-06-25 ENCOUNTER — Encounter (HOSPITAL_COMMUNITY)
Admission: RE | Disposition: A | Discharge status: Home / Self Care | Payer: Self-pay | Source: Ambulatory Visit | Attending: Neurosurgery

## 2014-06-25 ENCOUNTER — Encounter (HOSPITAL_COMMUNITY): Payer: Self-pay | Admitting: Anesthesiology

## 2014-06-25 DIAGNOSIS — Z79899 Other long term (current) drug therapy: Secondary | ICD-10-CM | POA: Insufficient documentation

## 2014-06-25 DIAGNOSIS — Z419 Encounter for procedure for purposes other than remedying health state, unspecified: Secondary | ICD-10-CM

## 2014-06-25 DIAGNOSIS — E785 Hyperlipidemia, unspecified: Secondary | ICD-10-CM | POA: Diagnosis not present

## 2014-06-25 DIAGNOSIS — F419 Anxiety disorder, unspecified: Secondary | ICD-10-CM | POA: Insufficient documentation

## 2014-06-25 DIAGNOSIS — G8929 Other chronic pain: Secondary | ICD-10-CM | POA: Diagnosis present

## 2014-06-25 DIAGNOSIS — E119 Type 2 diabetes mellitus without complications: Secondary | ICD-10-CM | POA: Diagnosis not present

## 2014-06-25 DIAGNOSIS — I10 Essential (primary) hypertension: Secondary | ICD-10-CM | POA: Diagnosis not present

## 2014-06-25 DIAGNOSIS — M961 Postlaminectomy syndrome, not elsewhere classified: Secondary | ICD-10-CM | POA: Insufficient documentation

## 2014-06-25 HISTORY — DX: Anxiety disorder, unspecified: F41.9

## 2014-06-25 HISTORY — PX: SPINAL CORD STIMULATOR REMOVAL: SHX5379

## 2014-06-25 HISTORY — DX: Hyperlipidemia, unspecified: E78.5

## 2014-06-25 HISTORY — DX: Other muscle spasm: M62.838

## 2014-06-25 LAB — BASIC METABOLIC PANEL
ANION GAP: 9 (ref 5–15)
BUN: 16 mg/dL (ref 6–23)
CALCIUM: 9.2 mg/dL (ref 8.4–10.5)
CHLORIDE: 103 mmol/L (ref 96–112)
CO2: 24 mmol/L (ref 19–32)
CREATININE: 0.93 mg/dL (ref 0.50–1.35)
GFR calc Af Amer: >90 mL/min (ref >90)
GFR calc non Af Amer: >90 mL/min (ref >90)
GLUCOSE: 141 mg/dL — AB (ref 70–99)
Potassium: 4.4 mmol/L (ref 3.5–5.1)
Sodium: 136 mmol/L (ref 135–145)

## 2014-06-25 LAB — CBC
HCT: 40.6 % (ref 39.0–52.0)
Hemoglobin: 14 g/dL (ref 13.0–17.0)
MCH: 31.2 pg (ref 26.0–34.0)
MCHC: 34.5 g/dL (ref 30.0–36.0)
MCV: 90.4 fL (ref 78.0–100.0)
Platelets: 284 10*3/uL (ref 150–400)
RBC: 4.49 MIL/uL (ref 4.22–5.81)
RDW: 12.5 % (ref 11.5–15.5)
WBC: 9.8 10*3/uL (ref 4.0–10.5)

## 2014-06-25 LAB — GLUCOSE, CAPILLARY
GLUCOSE-CAPILLARY: 127 mg/dL — AB (ref 70–99)
GLUCOSE-CAPILLARY: 110 mg/dL — AB (ref 70–99)
Glucose-Capillary: 96 mg/dL (ref 70–99)

## 2014-06-25 SURGERY — LUMBAR SPINAL CORD STIMULATOR REMOVAL
Anesthesia: General

## 2014-06-25 MED ORDER — GLYCOPYRROLATE 0.2 MG/ML IJ SOLN
INTRAMUSCULAR | Status: DC | PRN
  Administered 2014-06-25: .8 mg via INTRAVENOUS

## 2014-06-25 MED ORDER — THROMBIN 5000 UNITS EX SOLR
CUTANEOUS | Status: DC | PRN
  Administered 2014-06-25 (×2): 5000 [IU] via TOPICAL

## 2014-06-25 MED ORDER — FENTANYL CITRATE 0.05 MG/ML IJ SOLN
INTRAMUSCULAR | Status: DC | PRN
  Administered 2014-06-25 (×3): 50 ug via INTRAVENOUS
  Administered 2014-06-25: 100 ug via INTRAVENOUS

## 2014-06-25 MED ORDER — MUPIROCIN 2 % EX OINT: TOPICAL_OINTMENT | Freq: Two times a day (BID) | CUTANEOUS | Status: DC

## 2014-06-25 MED ORDER — OXYCODONE HCL 10 MG PO TABS: 10.0000 mg | ORAL_TABLET | Freq: Four times a day (QID) | ORAL | Status: DC

## 2014-06-25 MED ORDER — ATORVASTATIN CALCIUM 20 MG PO TABS
20.0000 mg | ORAL_TABLET | Freq: Every day | ORAL | Status: DC
  Administered 2014-06-25: 20 mg via ORAL
  Filled 2014-06-25: qty 1

## 2014-06-25 MED ORDER — DULOXETINE HCL 30 MG PO CPEP
30.0000 mg | ORAL_CAPSULE | Freq: Two times a day (BID) | ORAL | Status: DC
  Filled 2014-06-25: qty 1

## 2014-06-25 MED ORDER — ARTIFICIAL TEARS OP OINT
TOPICAL_OINTMENT | OPHTHALMIC | Status: DC | PRN
  Administered 2014-06-25: 1 via OPHTHALMIC

## 2014-06-25 MED ORDER — KETOROLAC TROMETHAMINE 30 MG/ML IJ SOLN
30.0000 mg | Freq: Four times a day (QID) | INTRAMUSCULAR | Status: DC
  Administered 2014-06-25: 30 mg via INTRAVENOUS
  Filled 2014-06-25: qty 1

## 2014-06-25 MED ORDER — MORPHINE SULFATE 2 MG/ML IJ SOLN
1.0000 mg | INTRAMUSCULAR | Status: DC | PRN
Start: 2014-06-25 — End: 2014-06-25

## 2014-06-25 MED ORDER — OXYCODONE-ACETAMINOPHEN 5-325 MG PO TABS
1.0000 | ORAL_TABLET | ORAL | Status: DC | PRN
  Administered 2014-06-25: 2 via ORAL
  Filled 2014-06-25: qty 2

## 2014-06-25 MED ORDER — MIDAZOLAM HCL 5 MG/5ML IJ SOLN
INTRAMUSCULAR | Status: DC | PRN
  Administered 2014-06-25 (×2): 1 mg via INTRAVENOUS

## 2014-06-25 MED ORDER — PROPOFOL 10 MG/ML IV BOLUS
INTRAVENOUS | Status: AC
  Filled 2014-06-25: qty 20

## 2014-06-25 MED ORDER — SODIUM CHLORIDE 0.9 % IJ SOLN
INTRAMUSCULAR | Status: AC
  Filled 2014-06-25: qty 10

## 2014-06-25 MED ORDER — NEOSTIGMINE METHYLSULFATE 10 MG/10ML IV SOLN
INTRAVENOUS | Status: AC
  Filled 2014-06-25: qty 1

## 2014-06-25 MED ORDER — GABAPENTIN 300 MG PO CAPS
600.0000 mg | ORAL_CAPSULE | Freq: Two times a day (BID) | ORAL | Status: DC
  Filled 2014-06-25: qty 2

## 2014-06-25 MED ORDER — OXYCODONE HCL 5 MG/5ML PO SOLN: 5.0000 mg | Freq: Once | ORAL | Status: DC | PRN

## 2014-06-25 MED ORDER — ROCURONIUM BROMIDE 100 MG/10ML IV SOLN
INTRAVENOUS | Status: DC | PRN
  Administered 2014-06-25: 10 mg via INTRAVENOUS
  Administered 2014-06-25: 35 mg via INTRAVENOUS
  Administered 2014-06-25: 15 mg via INTRAVENOUS

## 2014-06-25 MED ORDER — PROPOFOL 10 MG/ML IV BOLUS
INTRAVENOUS | Status: DC | PRN
  Administered 2014-06-25 (×2): 50 mg via INTRAVENOUS
  Administered 2014-06-25: 150 mg via INTRAVENOUS
  Administered 2014-06-25: 50 mg via INTRAVENOUS

## 2014-06-25 MED ORDER — POLYETHYLENE GLYCOL 3350 17 G PO PACK
17.0000 g | PACK | Freq: Every day | ORAL | Status: DC | PRN
  Filled 2014-06-25: qty 1

## 2014-06-25 MED ORDER — METFORMIN HCL 500 MG PO TABS
500.0000 mg | ORAL_TABLET | Freq: Two times a day (BID) | ORAL | Status: DC
  Administered 2014-06-25: 500 mg via ORAL
  Filled 2014-06-25 (×2): qty 1

## 2014-06-25 MED ORDER — SUCCINYLCHOLINE CHLORIDE 20 MG/ML IJ SOLN
INTRAMUSCULAR | Status: AC
  Filled 2014-06-25: qty 1

## 2014-06-25 MED ORDER — NEOSTIGMINE METHYLSULFATE 10 MG/10ML IV SOLN
INTRAVENOUS | Status: DC | PRN
  Administered 2014-06-25: 5 mg via INTRAVENOUS

## 2014-06-25 MED ORDER — SODIUM CHLORIDE 0.9 % IJ SOLN: 3.0000 mL | INTRAMUSCULAR | Status: DC | PRN

## 2014-06-25 MED ORDER — LIDOCAINE-EPINEPHRINE 0.5 %-1:200000 IJ SOLN
INTRAMUSCULAR | Status: DC | PRN
  Administered 2014-06-25: 10 mL

## 2014-06-25 MED ORDER — HEMOSTATIC AGENTS (NO CHARGE) OPTIME
TOPICAL | Status: DC | PRN
  Administered 2014-06-25: 1 via TOPICAL

## 2014-06-25 MED ORDER — ARTIFICIAL TEARS OP OINT
TOPICAL_OINTMENT | OPHTHALMIC | Status: AC
  Filled 2014-06-25: qty 3.5

## 2014-06-25 MED ORDER — ENALAPRIL MALEATE 20 MG PO TABS
20.0000 mg | ORAL_TABLET | Freq: Every day | ORAL | Status: DC
  Filled 2014-06-25: qty 1

## 2014-06-25 MED ORDER — SODIUM CHLORIDE 0.9 % IJ SOLN
3.0000 mL | Freq: Two times a day (BID) | INTRAMUSCULAR | Status: DC
  Administered 2014-06-25: 3 mL via INTRAVENOUS

## 2014-06-25 MED ORDER — CEFAZOLIN SODIUM 1-5 GM-% IV SOLN
1.0000 g | Freq: Three times a day (TID) | INTRAVENOUS | Status: DC
  Filled 2014-06-25 (×2): qty 50

## 2014-06-25 MED ORDER — ONDANSETRON HCL 4 MG/2ML IJ SOLN: 4.0000 mg | INTRAMUSCULAR | Status: DC | PRN

## 2014-06-25 MED ORDER — SENNA 8.6 MG PO TABS
1.0000 | ORAL_TABLET | Freq: Two times a day (BID) | ORAL | Status: DC
Start: 2014-06-25 — End: 2014-06-25

## 2014-06-25 MED ORDER — HYDROMORPHONE HCL 2 MG PO TABS: 2.0000 mg | ORAL_TABLET | ORAL | Status: AC | PRN

## 2014-06-25 MED ORDER — LACTATED RINGERS IV SOLN
INTRAVENOUS | Status: DC | PRN
  Administered 2014-06-25 (×2): via INTRAVENOUS

## 2014-06-25 MED ORDER — LIDOCAINE HCL (CARDIAC) 20 MG/ML IV SOLN
INTRAVENOUS | Status: DC | PRN
  Administered 2014-06-25: 75 mg via INTRAVENOUS
  Administered 2014-06-25: 25 mg via INTRAVENOUS

## 2014-06-25 MED ORDER — FENTANYL CITRATE 0.05 MG/ML IJ SOLN
INTRAMUSCULAR | Status: AC
  Filled 2014-06-25: qty 2

## 2014-06-25 MED ORDER — ONDANSETRON HCL 4 MG/2ML IJ SOLN: 4.0000 mg | Freq: Four times a day (QID) | INTRAMUSCULAR | Status: DC | PRN

## 2014-06-25 MED ORDER — ONDANSETRON HCL 4 MG/2ML IJ SOLN
INTRAMUSCULAR | Status: AC
  Filled 2014-06-25: qty 2

## 2014-06-25 MED ORDER — POTASSIUM CHLORIDE IN NACL 20-0.9 MEQ/L-% IV SOLN
INTRAVENOUS | Status: DC
  Filled 2014-06-25 (×2): qty 1000

## 2014-06-25 MED ORDER — CEFAZOLIN SODIUM-DEXTROSE 2-3 GM-% IV SOLR
INTRAVENOUS | Status: DC | PRN
  Administered 2014-06-25: 2 g via INTRAVENOUS

## 2014-06-25 MED ORDER — 0.9 % SODIUM CHLORIDE (POUR BTL) OPTIME
TOPICAL | Status: DC | PRN
  Administered 2014-06-25: 1000 mL

## 2014-06-25 MED ORDER — EPHEDRINE SULFATE 50 MG/ML IJ SOLN
INTRAMUSCULAR | Status: AC
  Filled 2014-06-25: qty 1

## 2014-06-25 MED ORDER — OXYCODONE HCL 5 MG PO TABS: 5.0000 mg | ORAL_TABLET | Freq: Once | ORAL | Status: DC | PRN

## 2014-06-25 MED ORDER — ONDANSETRON HCL 4 MG/2ML IJ SOLN
INTRAMUSCULAR | Status: DC | PRN
  Administered 2014-06-25: 4 mg via INTRAVENOUS

## 2014-06-25 MED ORDER — PHENOL 1.4 % MT LIQD: 1.0000 | OROMUCOSAL | Status: DC | PRN

## 2014-06-25 MED ORDER — FENTANYL CITRATE 0.05 MG/ML IJ SOLN
INTRAMUSCULAR | Status: AC
  Filled 2014-06-25: qty 5

## 2014-06-25 MED ORDER — MIDAZOLAM HCL 2 MG/2ML IJ SOLN
INTRAMUSCULAR | Status: AC
  Filled 2014-06-25: qty 2

## 2014-06-25 MED ORDER — FENTANYL CITRATE 0.05 MG/ML IJ SOLN
25.0000 ug | INTRAMUSCULAR | Status: DC | PRN
  Administered 2014-06-25 (×2): 50 ug via INTRAVENOUS

## 2014-06-25 MED ORDER — ACETAMINOPHEN 325 MG PO TABS: 650.0000 mg | ORAL_TABLET | ORAL | Status: DC | PRN

## 2014-06-25 MED ORDER — CYCLOBENZAPRINE HCL 10 MG PO TABS: 10.0000 mg | ORAL_TABLET | Freq: Three times a day (TID) | ORAL | Status: DC | PRN

## 2014-06-25 MED ORDER — GLYCOPYRROLATE 0.2 MG/ML IJ SOLN
INTRAMUSCULAR | Status: AC
  Filled 2014-06-25: qty 3

## 2014-06-25 MED ORDER — LACTATED RINGERS IV SOLN
INTRAVENOUS | Status: DC
  Administered 2014-06-25: 08:00:00 via INTRAVENOUS

## 2014-06-25 MED ORDER — MENTHOL 3 MG MT LOZG: 1.0000 | LOZENGE | OROMUCOSAL | Status: DC | PRN

## 2014-06-25 MED ORDER — LIDOCAINE HCL (CARDIAC) 20 MG/ML IV SOLN
INTRAVENOUS | Status: AC
  Filled 2014-06-25: qty 5

## 2014-06-25 MED ORDER — HYDROCODONE-ACETAMINOPHEN 5-325 MG PO TABS: 1.0000 | ORAL_TABLET | ORAL | Status: DC | PRN

## 2014-06-25 MED ORDER — METFORMIN HCL ER 500 MG PO TB24: 500.0000 mg | ORAL_TABLET | Freq: Two times a day (BID) | ORAL | Status: DC

## 2014-06-25 MED ORDER — ACETAMINOPHEN 650 MG RE SUPP: 650.0000 mg | RECTAL | Status: DC | PRN

## 2014-06-25 MED ORDER — ROCURONIUM BROMIDE 50 MG/5ML IV SOLN
INTRAVENOUS | Status: AC
  Filled 2014-06-25: qty 2

## 2014-06-25 MED ORDER — OXYCODONE HCL 5 MG PO TABS: 10.0000 mg | ORAL_TABLET | Freq: Four times a day (QID) | ORAL | Status: DC

## 2014-06-25 SURGICAL SUPPLY — 48 items
BLADE CLIPPER SURG (BLADE) IMPLANT
BUR MATCHSTICK NEURO 3.0 LAGG (BURR) IMPLANT
CONT SPEC 4OZ CLIKSEAL STRL BL (MISCELLANEOUS) ×2 IMPLANT
DECANTER SPIKE VIAL GLASS SM (MISCELLANEOUS) ×3 IMPLANT
DRAPE LAPAROTOMY T 102X78X121 (DRAPES) ×4 IMPLANT
DRAPE POUCH INSTRU U-SHP 10X18 (DRAPES) ×3 IMPLANT
DRSG OPSITE POSTOP 4X6 (GAUZE/BANDAGES/DRESSINGS) ×4 IMPLANT
DURAPREP 26ML APPLICATOR (WOUND CARE) ×3 IMPLANT
ELECT CAUTERY BLADE 6.4 (BLADE) ×3 IMPLANT
GLOVE ECLIPSE 6.5 STRL STRAW (GLOVE) ×3 IMPLANT
GLOVE ECLIPSE 7.5 STRL STRAW (GLOVE) ×8 IMPLANT
GLOVE EXAM NITRILE LRG STRL (GLOVE) IMPLANT
GLOVE EXAM NITRILE MD LF STRL (GLOVE) IMPLANT
GLOVE EXAM NITRILE XL STR (GLOVE) IMPLANT
GLOVE EXAM NITRILE XS STR PU (GLOVE) IMPLANT
GLOVE INDICATOR 8.0 STRL GRN (GLOVE) ×4 IMPLANT
GOWN STRL REUS W/ TWL LRG LVL3 (GOWN DISPOSABLE) ×2 IMPLANT
GOWN STRL REUS W/ TWL XL LVL3 (GOWN DISPOSABLE) IMPLANT
GOWN STRL REUS W/TWL 2XL LVL3 (GOWN DISPOSABLE) ×4 IMPLANT
GOWN STRL REUS W/TWL LRG LVL3 (GOWN DISPOSABLE) ×3
GOWN STRL REUS W/TWL XL LVL3 (GOWN DISPOSABLE)
IPG PRECISION SPECTRA (Stimulator) ×2 IMPLANT
KIT BASIN OR (CUSTOM PROCEDURE TRAY) ×3 IMPLANT
KIT CHARGING (KITS) ×2
KIT CHARGING PRECISION NEURO (KITS) IMPLANT
KIT REMOTE CONTROL PRECISION (KITS) ×2 IMPLANT
KIT ROOM TURNOVER OR (KITS) ×3 IMPLANT
LIQUID BAND (GAUZE/BANDAGES/DRESSINGS) ×3 IMPLANT
NDL HYPO 25X1 1.5 SAFETY (NEEDLE) ×1 IMPLANT
NDL SPNL 22GX3.5 QUINCKE BK (NEEDLE) IMPLANT
NEEDLE HYPO 25X1 1.5 SAFETY (NEEDLE) ×3 IMPLANT
NEEDLE SPNL 22GX3.5 QUINCKE BK (NEEDLE) ×9 IMPLANT
NS IRRIG 1000ML POUR BTL (IV SOLUTION) ×3 IMPLANT
PACK LAMINECTOMY NEURO (CUSTOM PROCEDURE TRAY) ×3 IMPLANT
PAD ARMBOARD 7.5X6 YLW CONV (MISCELLANEOUS) ×9 IMPLANT
SPONGE SURGIFOAM ABS GEL SZ50 (HEMOSTASIS) ×3 IMPLANT
SUT SILK 2 0 TIES 10X30 (SUTURE) ×3 IMPLANT
SUT VIC AB 0 CT1 18XCR BRD8 (SUTURE) ×1 IMPLANT
SUT VIC AB 0 CT1 8-18 (SUTURE) ×6
SUT VIC AB 2-0 CT1 18 (SUTURE) ×5 IMPLANT
SUT VIC AB 3-0 SH 8-18 (SUTURE) ×7 IMPLANT
SYR 20ML ECCENTRIC (SYRINGE) ×3 IMPLANT
SYR BULB 3OZ (MISCELLANEOUS) ×3 IMPLANT
SYR CONTROL 10ML LL (SYRINGE) ×3 IMPLANT
TOOL LONG TUNNEL (SPINAL CORD STIMULATOR) ×2 IMPLANT
TOWEL OR 17X24 6PK STRL BLUE (TOWEL DISPOSABLE) ×3 IMPLANT
TOWEL OR 17X26 10 PK STRL BLUE (TOWEL DISPOSABLE) ×3 IMPLANT
WATER STERILE IRR 1000ML POUR (IV SOLUTION) ×3 IMPLANT

## 2014-06-25 NOTE — Anesthesia Preprocedure Evaluation (Signed)
Anesthesia Evaluation  Patient identified by MRN, date of birth, ID band Patient awake    Reviewed: Allergy & Precautions, NPO status , Patient's Chart, lab work & pertinent test results  Airway Mallampati: II   Neck ROM: full    Dental   Pulmonary Current Smoker,  breath sounds clear to auscultation        Cardiovascular hypertension, Rhythm:regular Rate:Normal     Neuro/Psych Anxiety    GI/Hepatic   Endo/Other  diabetes, Type 2  Renal/GU      Musculoskeletal  (+) Arthritis -,   Abdominal   Peds  Hematology   Anesthesia Other Findings   Reproductive/Obstetrics                             Anesthesia Physical Anesthesia Plan  ASA: II  Anesthesia Plan: General   Post-op Pain Management:    Induction: Intravenous  Airway Management Planned: Oral ETT  Additional Equipment:   Intra-op Plan:   Post-operative Plan: Extubation in OR  Informed Consent: I have reviewed the patients History and Physical, chart, labs and discussed the procedure including the risks, benefits and alternatives for the proposed anesthesia with the patient or authorized representative who has indicated his/her understanding and acceptance.     Plan Discussed with: CRNA, Anesthesiologist and Surgeon  Anesthesia Plan Comments:         Anesthesia Quick Evaluation

## 2014-06-25 NOTE — Anesthesia Postprocedure Evaluation (Signed)
Anesthesia Post Note  Patient: Kevin Fletcher  Procedure(s) Performed: Procedure(s) (LRB): LUMBAR SPINAL CORD STIMULATOR repositioning (N/A)  Anesthesia type: General  Patient location: PACU  Post pain: Pain level controlled and Adequate analgesia  Post assessment: Post-op Vital signs reviewed, Patient's Cardiovascular Status Stable, Respiratory Function Stable, Patent Airway and Pain level controlled  Last Vitals:  Filed Vitals:   06/25/14 1440  BP: 107/63  Pulse: 55  Temp: 36.5 C  Resp: 18    Post vital signs: Reviewed and stable  Level of consciousness: awake, alert  and oriented  Complications: No apparent anesthesia complications

## 2014-06-25 NOTE — Discharge Instructions (Signed)
You may shower tomorrow. Do not remove the honeycomb dressing until Friday this week.  Do not engage in heavy physical activity, such as lifting more than 5 lbs, bending, crawling, housework for the next 2-3 weeks.  Call if you have a fever greater than 101.5 You may shower tomorrow, the wounds may get wet. Do not scrub the wounds, just pat them dry.

## 2014-06-25 NOTE — Progress Notes (Addendum)
Patient alert and oriented, mae's well, voiding adequate amount of urine, swallowing without difficulty, c/o moderate pain and MD aware . Patient with spinal cord stimulator. Patient discharged home with family. Script and discharged instructions given to patient. Patient and family stated understanding of d/c instructions given and has an appointment with MD. Marin RobertsAisha Arella Blinder RN

## 2014-06-25 NOTE — Plan of Care (Signed)
Problem: Consults Goal: Diagnosis - Spinal Surgery Outcome: Completed/Met Date Met:  06/25/14 LUMBAR SPINAL CORD STIMULATOR

## 2014-06-25 NOTE — Progress Notes (Signed)
Pharmacy note: metformin  54 yo male on metformin for history of diabetes.  The dose has been documented as metformin 500mg  XR po bid.  Metformin XR is typically dosed as once daily. His pharmacy [facility]) has no record of the medication and the patient can not confirm whether he takes immediate release of extended release.  Plan -Will change to metformin 500mg  po bid (immediate release) -Consider discharging on metformin 500mg  po bid as immediate release  Thank you, Harland Germanndrew Sheralee Qazi, Pharm D 06/25/2014 4:14 PM

## 2014-06-25 NOTE — H&P (Signed)
BP 121/55 mmHg  Pulse 74  Temp(Src) 97.5 F (36.4 C) (Oral)  Resp 20  Ht 5\' 10"  (1.778 m)  Wt 92.987 kg (205 lb)  BMI 29.41 kg/m2  SpO2 97% Cc: spinal cord stimulator adjustment Kevin Fletcher is admitted for permanent placement of a spinal cord stimulator and adjustment of the placement. No Known Allergies Prior to Admission medications   Medication Sig Start Date End Date Taking? Authorizing Provider  atorvastatin (LIPITOR) 20 MG tablet Take 20 mg by mouth daily.   Yes Historical Provider, MD  cephALEXin (KEFLEX) 500 MG capsule Take 1 capsule (500 mg total) by mouth 4 (four) times daily. 06/12/14  Yes Coletta MemosKyle Aylee Littrell, MD  cyclobenzaprine (FLEXERIL) 10 MG tablet Take 1 tablet (10 mg total) by mouth 3 (three) times daily as needed for muscle spasms. 06/12/14  Yes Coletta MemosKyle Jerine Surles, MD  DULoxetine (CYMBALTA) 30 MG capsule Take 30 mg by mouth 2 (two) times daily.  05/26/14  Yes Historical Provider, MD  enalapril (VASOTEC) 20 MG tablet Take 20 mg by mouth daily.   Yes Historical Provider, MD  gabapentin (NEURONTIN) 300 MG capsule Take 600 mg by mouth 2 (two) times daily.  05/26/14  Yes Historical Provider, MD  metFORMIN (GLUCOPHAGE-XR) 500 MG 24 hr tablet Take 500 mg by mouth 2 (two) times daily.   Yes Historical Provider, MD  Oxycodone HCl 10 MG TABS Take 10 mg by mouth 4 (four) times daily. scheduled 05/16/14  Yes Historical Provider, MD  Oxycodone HCl 10 MG TABS Take 1 tablet (10 mg total) by mouth 4 (four) times daily. 06/12/14   Coletta MemosKyle Tavari Loadholt, MD   Past Surgical History  Procedure Laterality Date  . Back surgery  2000  . Hernia repair  2000     umbilical  . Pilonidal cyst excision    . Spinal cord stimulator trial N/A 06/12/2014    Procedure: LUMBAR SPINAL CORD STIMULATOR TRIAL;  Surgeon: Coletta MemosKyle Nicollette Wilhelmi, MD;  Location: [facility];  Service: Neurosurgery;  Laterality: N/A;  spinal cord stimulator trial with paddle lead-top of T8 final position   Past Medical History  Diagnosis Date  . Shortness of  breath dyspnea     occ  . Arthritis   . Hypertension     takes Enalapril daily  . Hyperlipidemia     takes Lipitor daily  . Muscle spasm     takes Flexeril daily  . Anxiety     takes Cymbalta daily  . Diabetes mellitus without complication     takes Metformin daily   Physical Exam  Constitutional: He is oriented to person, place, and time. He appears well-developed and well-nourished.  HENT:  Head: Normocephalic and atraumatic.  Right Ear: External ear normal.  Left Ear: External ear normal.  Mouth/Throat: Oropharynx is clear and moist.  Eyes: EOM are normal. Pupils are equal, round, and reactive to light.  Neck: Normal range of motion. Neck supple.  Cardiovascular: Normal rate, regular rhythm, normal heart sounds and intact distal pulses.   Pulmonary/Chest: Effort normal and breath sounds normal.  Abdominal: Soft. Bowel sounds are normal.  Musculoskeletal: Normal range of motion.  Neurological: He is alert and oriented to person, place, and time. He has normal reflexes. He displays normal reflexes. No cranial nerve deficit. He exhibits normal muscle tone. Coordination normal.  Skin: Skin is warm and dry.  Psychiatric: He has a normal mood and affect. His behavior is normal. Judgment and thought content normal.  A/P Admit for scs placement. Risks and benefits including  but not limited to were discussed, bleeding, infection, need for further surgery, hardware failure, were discussed. He understands and wishes to proceed.

## 2014-06-25 NOTE — Anesthesia Procedure Notes (Signed)
Procedure Name: Intubation Date/Time: 06/25/2014 11:13 AM Performed by: Fransisca KaufmannMEYER, Jiya Kissinger E Pre-anesthesia Checklist: Patient identified, Emergency Drugs available, Suction available, Patient being monitored and Timeout performed Patient Re-evaluated:Patient Re-evaluated prior to inductionOxygen Delivery Method: Circle system utilized Preoxygenation: Pre-oxygenation with 100% oxygen Intubation Type: IV induction Ventilation: Mask ventilation without difficulty Laryngoscope Size: Mac and 4 Grade View: Grade I Tube type: Oral Tube size: 8.0 mm Number of attempts: 1 Airway Equipment and Method: Patient positioned with wedge pillow and Stylet Placement Confirmation: ETT inserted through vocal cords under direct vision,  positive ETCO2 and breath sounds checked- equal and bilateral Secured at: 23 cm Dental Injury: Teeth and Oropharynx as per pre-operative assessment

## 2014-06-25 NOTE — Transfer of Care (Signed)
Immediate Anesthesia Transfer of Care Note  Patient: Kevin Fletcher  Procedure(s) Performed: Procedure(s): LUMBAR SPINAL CORD STIMULATOR repositioning (N/A)  Patient Location: PACU  Anesthesia Type:General  Level of Consciousness: awake, alert  and oriented  Airway & Oxygen Therapy: Patient Spontanous Breathing and Patient connected to nasal cannula oxygen  Post-op Assessment: Report given to RN and Patient moving all extremities X 4  Post vital signs: Reviewed and stable  Last Vitals:  Filed Vitals:   06/25/14 0737  BP: 121/55  Pulse: 74  Temp: 36.4 C  Resp: 20    Complications: No apparent anesthesia complications

## 2014-06-25 NOTE — Op Note (Signed)
06/25/2014  1:37 PM  PATIENT:  Kevin LakeWilliam R Fletcher  54 y.o. male with chronic pain admitted today for internalization of his leads, adjustment of the lead which had migrated rostrally, and placement of the generator  PRE-OPERATIVE DIAGNOSIS:  post laminectomy syndrome  POST-OPERATIVE DIAGNOSIS:  post laminectomy syndrome  PROCEDURE:  Procedure(s): LUMBAR SPINAL CORD STIMULATOR repositioning  SURGEON: Surgeon(s): Coletta MemosKyle Jalisha Enneking, MD  ASSISTANTS:none  ANESTHESIA:   general  EBL:  Total I/O In: 1000 [I.V.:1000] Out: -   BLOOD ADMINISTERED:none  CELL SAVER GIVEN:none  COUNT:per nursing  DRAINS: none   SPECIMEN:  No Specimen  DICTATION: Kevin Fletcher was taken to the operating room, intubated, and placed under a general anesthetic  without difficulty.  His lumbar and thoracic regions wereI prepped and draped in a sterile manner. I placed a tonsil clamp on the external leads to make their removal easier. I opened the thoracic incision cut the leads distal to the connectors. I then removed the connectors for the external leads . The external leads were then removed using the tonsil clamp.  I opened the paraspinous incision with a 10 blade, then tunneled to the thoracic incision. I using fluoroscopy moved the paddle caudally so that it covered the T8 vertebral body just reaching the T7/8 disc space. I created a pocket for the generator and connected the paddle leads. The leads were verified externally, and i then placed the generator, writing side towards the skin, with the leads buried underneath the generator. The thoracic wound and the paraspinous lumbar wound were closed in layers using vicryl sutures and staples on the skin edges in the thoracic wound. I used an occlusive dressing on the thoracic incision, and dermabond for the paraspinous wound.   PLAN OF CARE: Admit for overnight observation  PATIENT DISPOSITION:  PACU - hemodynamically stable.   Delay start of Pharmacological  VTE agent (>24hrs) due to surgical blood loss or risk of bleeding:  yes

## 2014-06-26 ENCOUNTER — Encounter (HOSPITAL_COMMUNITY): Payer: Self-pay | Admitting: Neurosurgery

## 2014-06-26 LAB — HEMOGLOBIN A1C
Hgb A1c MFr Bld: 6.8 % — ABNORMAL HIGH (ref 4.8–5.6)
MEAN PLASMA GLUCOSE: 148 mg/dL

## 2015-08-02 IMAGING — RF DG THORACIC SPINE 1V
1 series · 1 of 1 positions shown · non-contrast
Comparison: None.

CLINICAL DATA: Spine stimulator placement

EXAM:
OPERATIVE THORACIC SPINE 1 VIEW(S)

[Series 1: run · 1 of 1 slices shown]
[im 1/1]
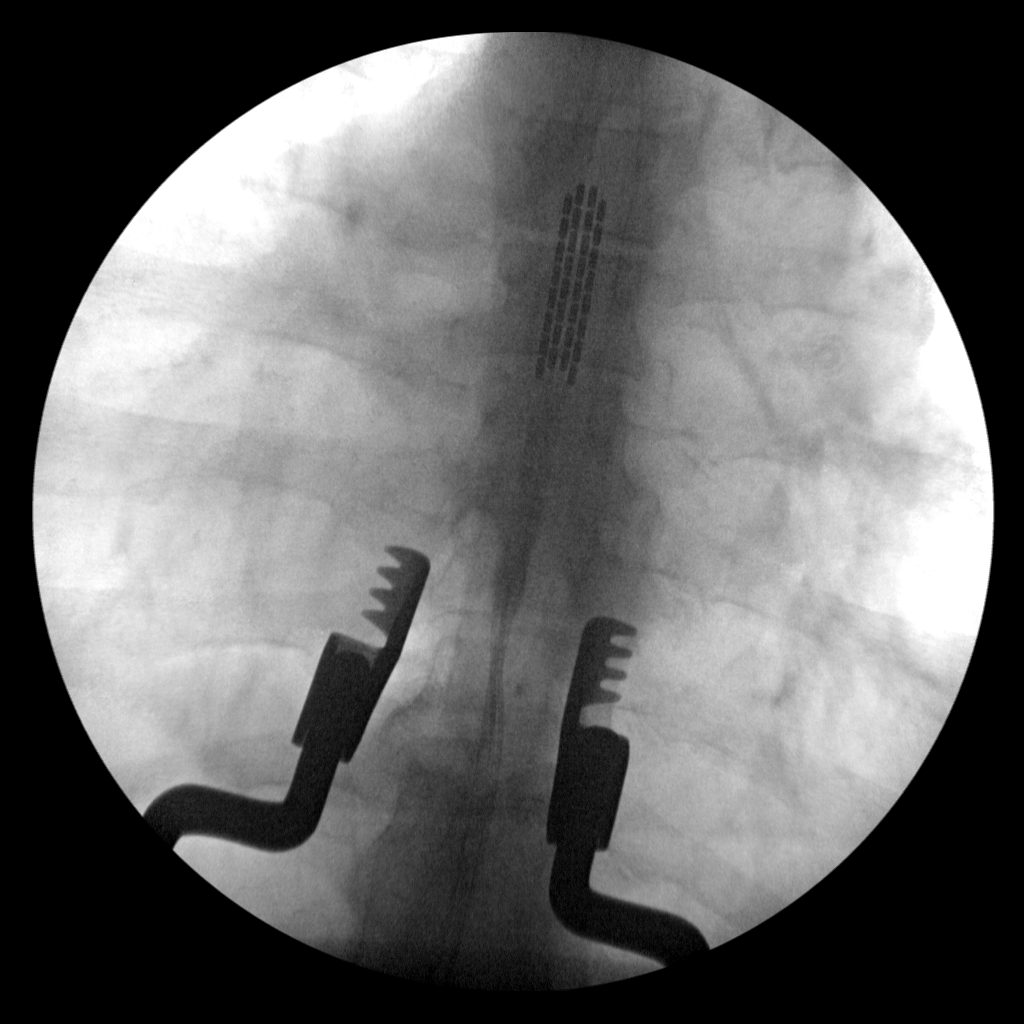

[1 of 1 positions shown; findings below may reference images not displayed]

FINDINGS: Four leads from the inserted spine stimulator project over the
central thoracic spine
IMPRESSION: Operative imaging during thoracic spine stimulator placement. Please
refer to the procedure report for a complete description.

## 2016-03-07 ENCOUNTER — Telehealth: Payer: Self-pay | Admitting: *Deleted

## 2018-07-05 ENCOUNTER — Other Ambulatory Visit: Payer: Self-pay

## 2018-07-05 ENCOUNTER — Encounter (HOSPITAL_COMMUNITY): Payer: Self-pay | Admitting: Emergency Medicine

## 2018-07-05 ENCOUNTER — Emergency Department (HOSPITAL_COMMUNITY)
Admission: EM | Admit: 2018-07-05 | Discharge: 2018-07-05 | Disposition: A | Discharge status: Home / Self Care | Payer: BLUE CROSS/BLUE SHIELD | Attending: Emergency Medicine | Admitting: Emergency Medicine

## 2018-07-05 DIAGNOSIS — Z79899 Other long term (current) drug therapy: Secondary | ICD-10-CM | POA: Insufficient documentation

## 2018-07-05 DIAGNOSIS — K0889 Other specified disorders of teeth and supporting structures: Secondary | ICD-10-CM | POA: Insufficient documentation

## 2018-07-05 DIAGNOSIS — Z7984 Long term (current) use of oral hypoglycemic drugs: Secondary | ICD-10-CM | POA: Insufficient documentation

## 2018-07-05 DIAGNOSIS — K0401 Reversible pulpitis: Secondary | ICD-10-CM | POA: Insufficient documentation

## 2018-07-05 DIAGNOSIS — F17210 Nicotine dependence, cigarettes, uncomplicated: Secondary | ICD-10-CM | POA: Insufficient documentation

## 2018-07-05 DIAGNOSIS — E119 Type 2 diabetes mellitus without complications: Secondary | ICD-10-CM | POA: Insufficient documentation

## 2018-07-05 DIAGNOSIS — I10 Essential (primary) hypertension: Secondary | ICD-10-CM | POA: Insufficient documentation

## 2018-07-05 MED ORDER — BENZOCAINE 10 % MT GEL: 1.0000 "application " | OROMUCOSAL | 0 refills | Status: DC | PRN

## 2018-07-05 MED ORDER — BENZOCAINE 10 % MT GEL: 1.0000 "application " | OROMUCOSAL | 0 refills | Status: AC | PRN

## 2018-07-05 MED ORDER — PENICILLIN V POTASSIUM 500 MG PO TABS: 500.0000 mg | ORAL_TABLET | Freq: Four times a day (QID) | ORAL | 0 refills | Status: AC

## 2018-07-05 MED ORDER — PENICILLIN V POTASSIUM 500 MG PO TABS: 500.0000 mg | ORAL_TABLET | Freq: Four times a day (QID) | ORAL | 0 refills | Status: DC

## 2018-07-05 NOTE — ED Provider Notes (Addendum)
[facility] EMERGENCY DEPARTMENT Provider Note   CSN: [id number] Arrival date & time: 07/05/18  1527     History   Chief Complaint Chief Complaint  Patient presents with  . Dental Pain    HPI Kevin Fletcher is a 58 y.o. male.  Kevin Fletcher is a 58 y.o. male with a history of hypertension, hyperlipidemia, diabetes, arthritis and anxiety, who presents to the emergency department for evaluation of right lower dental pain.  Patient reports that for the past 3 days he has had increasing pain in this area.  Reports his teeth are in poor condition at baseline and he knows he needs multiple pulled but has been unable to afford a dentist.  Reports over the past few days he has had worsening pain in this area and pain with chewing, but no difficulty swallowing.  He denies any pain or swelling underneath the tongue.  No fevers or chills, no nausea or vomiting.  No neck pain or torticollis.  He is taken Tylenol and used a mouthwash prior to coming in but has not tried anything else to treat his symptoms.     Past Medical History:  Diagnosis Date  . Anxiety    takes Cymbalta daily  . Arthritis   . Diabetes mellitus without complication (HCC)    takes Metformin daily  . Hyperlipidemia    takes Lipitor daily  . Hypertension    takes Enalapril daily  . Muscle spasm    takes Flexeril daily  . Shortness of breath dyspnea    occ    Patient Active Problem List   Diagnosis Date Noted  . Chronic pain 06/12/2014    Past Surgical History:  Procedure Laterality Date  . BACK SURGERY  2000  . HERNIA REPAIR  2000    umbilical  . PILONIDAL CYST EXCISION    . SPINAL CORD STIMULATOR REMOVAL N/A 06/25/2014   Procedure: LUMBAR SPINAL CORD STIMULATOR repositioning;  Surgeon: Coletta Memos, MD;  Location: [facility];  Service: Neurosurgery;  Laterality: N/A;  . SPINAL CORD STIMULATOR TRIAL N/A 06/12/2014   Procedure: LUMBAR SPINAL CORD STIMULATOR TRIAL;  Surgeon: Coletta Memos, MD;  Location: [facility];  Service: Neurosurgery;  Laterality: N/A;  spinal cord stimulator trial with paddle lead-top of T8 final position        Home Medications    Prior to Admission medications   Medication Sig Start Date End Date Taking? Authorizing Provider  atorvastatin (LIPITOR) 20 MG tablet Take 20 mg by mouth daily.    [provider]  cephALEXin (KEFLEX) 500 MG capsule Take 1 capsule (500 mg total) by mouth 4 (four) times daily. 06/12/14   Coletta Memos, MD  cyclobenzaprine (FLEXERIL) 10 MG tablet Take 1 tablet (10 mg total) by mouth 3 (three) times daily as needed for muscle spasms. 06/12/14   Coletta Memos, MD  DULoxetine (CYMBALTA) 30 MG capsule Take 30 mg by mouth 2 (two) times daily.  05/26/14   [provider]  enalapril (VASOTEC) 20 MG tablet Take 20 mg by mouth daily.    [provider]  gabapentin (NEURONTIN) 300 MG capsule Take 600 mg by mouth 2 (two) times daily.  05/26/14   [provider]  HYDROmorphone (DILAUDID) 2 MG tablet Take 1 tablet (2 mg total) by mouth every 4 (four) hours as needed for severe pain. 06/25/14   Coletta Memos, MD  metFORMIN (GLUCOPHAGE-XR) 500 MG 24 hr tablet Take 500 mg by mouth 2 (two)  times daily.    [provider]  mupirocin ointment (BACTROBAN) 2 %  06/07/14   [provider]  Oxycodone HCl 10 MG TABS Take 10 mg by mouth 4 (four) times daily. scheduled 05/16/14   [provider]  Oxycodone HCl 10 MG TABS Take 1 tablet (10 mg total) by mouth 4 (four) times daily. 06/12/14   Coletta Memos, MD    Family History No family history on file.  Social History Social History   Tobacco Use  . Smoking status: Current Every Day Smoker    Packs/day: 1.00    Years: 30.00    Pack years: 30.00    Types: Cigarettes  . Smokeless tobacco: Never Used  Substance Use Topics  . Alcohol use: Yes    Comment: occ beer  . Drug use: No     Allergies   Patient has no known  allergies.   Review of Systems Review of Systems  Constitutional: Negative for chills and fever.  HENT: Positive for dental problem. Negative for facial swelling, sore throat, trouble swallowing and voice change.   Respiratory: Negative for stridor.   Musculoskeletal: Negative for neck pain and neck stiffness.  Skin: Negative for color change and rash.     Physical Exam Updated Vital Signs BP (!) 158/81 (BP Location: Right Arm)   Pulse 77   Temp 97.6 F (36.4 C) (Oral)   Resp 18   SpO2 100%   Physical Exam Vitals signs and nursing note reviewed.  Constitutional:      General: He is not in acute distress.    Appearance: He is well-developed. He is not diaphoretic.  HENT:     Head: Normocephalic and atraumatic.     Mouth/Throat:     Comments: Multiple teeth are in very poor dentition, multiple broken right lower molars with some erythema and swelling over the gums and tenderness in this area but no drainable abscess noted, there is no tenderness or induration sublingually, posterior oropharynx is clear and moist with uvula midline.  Patient is tolerating secretions without difficulty, normal phonation. Eyes:     General:        Right eye: No discharge.        Left eye: No discharge.  Neck:     Musculoskeletal: Neck supple. No neck rigidity.  Pulmonary:     Effort: Pulmonary effort is normal. No respiratory distress.  Skin:    General: Skin is warm and dry.  Neurological:     Mental Status: He is alert and oriented to person, place, and time.     Coordination: Coordination normal.  Psychiatric:        Mood and Affect: Mood normal.        Behavior: Behavior normal.      ED Treatments / Results  Labs (all labs ordered are listed, but only abnormal results are displayed) Labs Reviewed - No data to display  EKG None  Radiology No results found.  Procedures Procedures (including critical care time)  Medications Ordered in ED Medications - No data to  display   Initial Impression / Assessment and Plan / ED Course  I have reviewed the triage vital signs and the nursing notes.  Pertinent labs & imaging results that were available during my care of the patient were reviewed by me and considered in my medical decision making (see chart for details).  Patient with toothache.  No gross abscess.  Exam unconcerning for Ludwig's angina or spread of infection.  Will treat  with penicillin and anti-inflammatories medicine.  Urged patient to follow-up with dentist.    Final Clinical Impressions(s) / ED Diagnoses   Final diagnoses:  Pain, dental  Pulpitis    ED Discharge Orders         Ordered    penicillin v potassium (VEETID) 500 MG tablet  4 times daily     07/05/18 1737    benzocaine (ORAJEL) 10 % mucosal gel  As needed     07/05/18 1737           Dartha LodgeFord,  N, [place]PA-C 07/05/18 1737    Dartha LodgeFord,  N, PA-C 07/05/18 1739    Benjiman CorePickering, Nathan, MD 07/05/18 [phone]

## 2018-07-05 NOTE — Discharge Instructions (Signed)
Please take entire course of antibiotics as directed.  Continue using ibuprofen and Tylenol, as well as prescribed Orajel for pain.  You will need to follow-up with your dentist for continued management of this.  Return to the emergency department for fevers, swelling or pain under the tongue or in the neck, difficulty breathing or swallowing or any other new or concerning symptoms. 

## 2018-07-05 NOTE — ED Triage Notes (Signed)
Pt c/o right dental pain x 3 days.

## 2020-07-21 DEATH — deceased
